# Patient Record
Sex: Female | Born: 1980
Health system: Southern US, Community
[De-identification: ages and names within clinical notes are randomized; demographics above are authoritative.]

## PROBLEM LIST (undated history)

## (undated) DIAGNOSIS — R7611 Nonspecific reaction to tuberculin skin test without active tuberculosis: Secondary | ICD-10-CM

## (undated) DIAGNOSIS — K219 Gastro-esophageal reflux disease without esophagitis: Secondary | ICD-10-CM

## (undated) DIAGNOSIS — Z87898 Personal history of other specified conditions: Secondary | ICD-10-CM

## (undated) DIAGNOSIS — I10 Essential (primary) hypertension: Secondary | ICD-10-CM

## (undated) DIAGNOSIS — R519 Headache, unspecified: Secondary | ICD-10-CM

## (undated) DIAGNOSIS — T7840XA Allergy, unspecified, initial encounter: Secondary | ICD-10-CM

## (undated) HISTORY — DX: Gastro-esophageal reflux disease without esophagitis: K21.9

## (undated) HISTORY — DX: Allergy, unspecified, initial encounter: T78.40XA

## (undated) HISTORY — DX: Nonspecific reaction to tuberculin skin test without active tuberculosis: R76.11

## (undated) HISTORY — DX: Personal history of other specified conditions: Z87.898

---
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
## 2016-11-26 LAB — HM PAP SMEAR

## 2018-11-26 ENCOUNTER — Other Ambulatory Visit: Payer: Self-pay

## 2018-11-26 ENCOUNTER — Encounter: Payer: Self-pay | Admitting: Family Medicine

## 2018-11-26 ENCOUNTER — Ambulatory Visit (INDEPENDENT_AMBULATORY_CARE_PROVIDER_SITE_OTHER): Payer: No Typology Code available for payment source | Admitting: Family Medicine

## 2018-11-26 VITALS — BP 110/78 | HR 71 | Temp 98.1°F | Resp 16 | Ht 60.75 in | Wt 121.1 lb

## 2018-11-26 DIAGNOSIS — Z0000 Encounter for general adult medical examination without abnormal findings: Secondary | ICD-10-CM | POA: Diagnosis not present

## 2018-11-26 DIAGNOSIS — J301 Allergic rhinitis due to pollen: Secondary | ICD-10-CM | POA: Diagnosis not present

## 2018-11-26 DIAGNOSIS — J302 Other seasonal allergic rhinitis: Secondary | ICD-10-CM | POA: Insufficient documentation

## 2018-11-26 NOTE — Patient Instructions (Signed)
Follow up in 1 year or as needed Keep up the good work!  You look great! We'll call you with your allergy appt Call with any questions or concerns Welcome!  We're glad to have you! Happy New Year!!!

## 2018-11-26 NOTE — Assessment & Plan Note (Signed)
Pt has hx of this.  Asking for referral to one of her allergy partners- referral placed.

## 2018-11-26 NOTE — Assessment & Plan Note (Signed)
Pt's PE WNL.  UTD on pap, immunizations.  No need for labs (pt will do through [facility] for [facility]).  Anticipatory guidance provided.

## 2018-11-26 NOTE — Progress Notes (Signed)
   Subjective:    Patient ID: Sarah [name], female    DOB: [birth year], 38 y.o.   MRN: [id number]  HPI New to establish.  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  UTD on pap, last done 2018.  No concerns today.  Needs allergy referral due to insurance   Review of Systems Patient reports no vision/ hearing changes, adenopathy,fever, weight change,  persistant/recurrent hoarseness , swallowing issues, chest pain, palpitations, edema, persistant/recurrent cough, hemoptysis, dyspnea (rest/exertional/paroxysmal nocturnal), gastrointestinal bleeding (melena, rectal bleeding), abdominal pain, significant heartburn, bowel changes, GU symptoms (dysuria, hematuria, incontinence), Gyn symptoms (abnormal  bleeding, pain),  syncope, focal weakness, memory loss, numbness & tingling, skin/hair/nail changes, abnormal bruising or bleeding, anxiety, or depression.     Objective:   Physical Exam General Appearance:    Alert, cooperative, no distress, appears stated age  Head:    Normocephalic, without obvious abnormality, atraumatic  Eyes:    PERRL, conjunctiva/corneas clear, EOM's intact, fundi    benign, both eyes  Ears:    Normal TM's and external ear canals, both ears  Nose:   Nares normal, septum midline, mucosa normal, no drainage    or sinus tenderness  Throat:   Lips, mucosa, and tongue normal; teeth and gums normal  Neck:   Supple, symmetrical, trachea midline, no adenopathy;    Thyroid: no enlargement/tenderness/nodules  Back:     Symmetric, no curvature, ROM normal, no CVA tenderness  Lungs:     Clear to auscultation bilaterally, respirations unlabored  Chest Wall:    No tenderness or deformity   Heart:    Regular rate and rhythm, S1 and S2 normal, no murmur, rub   or gallop  Breast Exam:    Deferred to GYN  Abdomen:     Soft, non-tender, bowel sounds active all four quadrants,    no masses, no organomegaly  Genitalia:    Deferred to GYN  Rectal:    Extremities:   Extremities normal,  atraumatic, no cyanosis or edema  Pulses:   2+ and symmetric all extremities  Skin:   Skin color, texture, turgor normal, no rashes or lesions  Lymph nodes:   Cervical, supraclavicular, and axillary nodes normal  Neurologic:   CNII-XII intact, normal strength, sensation and reflexes    throughout          Assessment & Plan:

## 2018-12-09 ENCOUNTER — Encounter: Payer: Self-pay | Admitting: Family Medicine

## 2018-12-09 ENCOUNTER — Telehealth: Payer: No Typology Code available for payment source | Admitting: Family Medicine

## 2018-12-09 DIAGNOSIS — J329 Chronic sinusitis, unspecified: Secondary | ICD-10-CM | POA: Diagnosis not present

## 2018-12-09 MED ORDER — DOXYCYCLINE HYCLATE 100 MG PO TABS: 100.0000 mg | ORAL_TABLET | Freq: Two times a day (BID) | ORAL | 0 refills | Status: DC

## 2018-12-09 MED FILL — DOXYCYCLINE HYCLATE 100 MG: 100 | 10 days supply | Qty: 20 | Fill #0

## 2018-12-09 NOTE — Addendum Note (Signed)
Addended by: Jannifer Rodney A on: 12/09/2018 04:50 PM   Modules accepted: Orders

## 2018-12-09 NOTE — Progress Notes (Signed)

## 2018-12-19 ENCOUNTER — Encounter: Payer: Self-pay | Admitting: Allergy

## 2018-12-19 ENCOUNTER — Ambulatory Visit (INDEPENDENT_AMBULATORY_CARE_PROVIDER_SITE_OTHER): Payer: No Typology Code available for payment source | Admitting: Allergy

## 2018-12-19 VITALS — BP 120/82 | HR 92 | Temp 98.1°F | Resp 16 | Ht 60.5 in | Wt 123.6 lb

## 2018-12-19 DIAGNOSIS — J4520 Mild intermittent asthma, uncomplicated: Secondary | ICD-10-CM | POA: Diagnosis not present

## 2018-12-19 DIAGNOSIS — T781XXD Other adverse food reactions, not elsewhere classified, subsequent encounter: Secondary | ICD-10-CM | POA: Diagnosis not present

## 2018-12-19 DIAGNOSIS — L309 Dermatitis, unspecified: Secondary | ICD-10-CM

## 2018-12-19 DIAGNOSIS — J301 Allergic rhinitis due to pollen: Secondary | ICD-10-CM

## 2018-12-19 DIAGNOSIS — H1013 Acute atopic conjunctivitis, bilateral: Secondary | ICD-10-CM

## 2018-12-19 NOTE — Progress Notes (Signed)
New Patient Note  RE: Sarah [name] MRN: [id number] DOB: [birth year] Date of Office Visit: 12/19/2018  Referring provider: Sheliah Hatch, MD Primary care provider: Sheliah Hatch, MD  Chief Complaint: allergies  History of present illness: Sarah [name] is a 38 y.o. female presenting today for consultation for allergic rhinitis.    She reports primarily having springtime symptoms including itchy/watery eyes, sneezing, nasal congestion/drainage.  She has had these issues for the past 9 years or so.  She has had some allergy testing in the past and knows that she is positive to birch. She states she has good response with the use of Xyzal, Nasonex and Optivar that she only has to take in the springtime. She states she has pollen food allergy syndrome as with fresh apples and cherries she does develop an itchy mouth and mild lip swelling.  That she tries to avoid fresh apples and cherries. She is now concerned with possible food allergy to soy, tree nuts and potentially sesame as after ingestion she develops a coughing and itchy throat.  That she has been trying to avoid these items as well.  This has become an issue over the past year or so. She feels she has an asthma attack while running outside in cold weather where she has cough and SOB about 8 years or so ago.  She does feels with illness she gets some chest tightness.  She states she has used her daughter's albuterol on occasion during illnesses when she does have the chest tightness. No history of eczema.  However she does have a rough dry patch on her upper back that she states is not itchy but it does not change.  She has not tried any topical medications on this rash.  Review of systems: Review of Systems  Constitutional: Negative for chills, fever and malaise/fatigue.  HENT: Negative for congestion, nosebleeds and sore throat.   Eyes: Negative for pain, discharge and redness.  Respiratory: Negative for cough, shortness of breath  and wheezing.   Cardiovascular: Negative for chest pain.  Gastrointestinal: Negative for abdominal pain, constipation, diarrhea, heartburn, nausea and vomiting.  Musculoskeletal: Negative for joint pain.  Skin: Positive for rash. Negative for itching.  Neurological: Negative for headaches.    All other systems negative unless noted above in HPI  Past medical history: Past Medical History:  Diagnosis Date  . Allergy   . GERD (gastroesophageal reflux disease)   . History of urinary incontinence    just after birth of children  . 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 TONSILLECTOMY AND ADENOIDECTOMY  1993    Family history:  Family History  Problem Relation Age of Onset  . Hypertension Mother   . Polycythemia Mother   . Allergic rhinitis Father   . Healthy Brother   . Allergic rhinitis Brother   . Food Allergy Brother   . Healthy Daughter   . Dementia Maternal Grandmother   . Hyperlipidemia Maternal Grandfather   . Hypertension Maternal Grandfather   . Stroke Maternal Grandfather   . Arthritis Paternal Grandmother   . Hearing loss Paternal Grandmother   . Asthma Neg Hx   . Angioedema Neg Hx   . Eczema Neg Hx   . Urticaria Neg Hx     Social history: She is an Proofreader.  Lives in a home with carpeting with gas heating and central cooling.  There is  carpeting in the bedroom.  There is a dog in the home.  There is no concern for water damage or mildew or roaches in the home.  She has no smoking history.  Medication List: Allergies as of 12/19/2018   No Known Allergies     Medication List       Accurate as of December 19, 2018  6:07 PM. Always use your most recent med list.        azelastine 0.05 % ophthalmic solution Commonly known as:  OPTIVAR 1 drop 2 (two) times daily.   doxycycline 100 MG tablet Commonly known as:  VIBRA-TABS Take 1 tablet (100 mg total) by mouth 2  (two) times daily.   levocetirizine 5 MG tablet Commonly known as:  XYZAL Take 5 mg by mouth every evening.   mometasone 50 MCG/ACT nasal spray Commonly known as:  NASONEX Place 2 sprays into the nose daily.   multivitamin tablet Take 1 tablet by mouth daily.       Known medication allergies: No Known Allergies   Physical examination: Blood pressure 120/82, pulse 92, temperature 98.1 F (36.7 C), temperature source Oral, resp. rate 16, height 5' 0.5" (1.537 m), weight 123 lb 9.6 oz (56.1 kg), SpO2 98 %.  General: Alert, interactive, in no acute distress. HEENT: PERRLA, TMs pearly gray, turbinates minimally edematous with clear discharge, post-pharynx non erythematous. Neck: Supple without lymphadenopathy. Lungs: Clear to auscultation without wheezing, rhonchi or rales. {no increased work of breathing. CV: Normal S1, S2 without murmurs. Abdomen: Nondistended, nontender. Skin: dry, mild hyperpigmented fine papular rash about the size of a silver dollar. Extremities:  No clubbing, cyanosis or edema. Neuro:   Grossly intact.  Diagnositics/Labs: Spirometry: FEV1: 2.18L 92%, FVC: 2.5L 91%, ratio consistent with nonobstructive pattern  Allergy testing: environmental allergy skin prick testing is positive to [place] grass, lamb quarters, tree pollens, cat.   Select food allergy skin prick testing is positive to soybean, sesame, walnut and almond Allergy testing results were read and interpreted by provider, documented by clinical staff.   Assessment and plan:   Allergic rhinitis with conjunctivitis  - environmental allergy skin testing today is positive to tree pollens, [place] grass, lamb's quarter and cat.   - allergen avoidance measures provided  - continue use of Xyzal 5mg  daily as needed (daily during spring)  - continue Nasonex 2 sprays each nostril daily as needed.  Recommend use for 1-2 weeks at time before stopping once symptoms improve.    - use nasal saline spray  or gel to help keep nose moisturized and decrease risk of nosebleeds  - continue use of Optivar eye drop 1 eye twice a day as needed for itchy/red/watery eyes  - allergen immunotherapy discussed today including protocol, benefits and risk.  Will see how symptoms are this spring and if not management with medication consider starting immunotherapy.    Adverse food reaction   - skin testing to select foods is positive to soybean, sesame, walnut and almond  - would recommend avoidance of foods as above  - have access to self-injectable epinephrine (Epipen or AuviQ) 0.3mg  at all times  - follow emergency action plan in case of allergic reaction  Pollen food allergy syndrome  - The oral allergy syndrome (OAS) or pollen-food allergy syndrome (PFAS) is a relatively common form of food allergy, particularly in adults. It typically occurs in people who have pollen allergies when the immune system "sees" proteins on the food that look like proteins on the pollen. This  results in the allergy antibody (IgE) binding to the food instead of the pollen. Patients typically report itching and/or mild swelling of the mouth and throat immediately following ingestion of certain uncooked fruits (including nuts) or raw vegetables. Only a very small number of affected individuals experience systemic allergic reactions, such as anaphylaxis which occurs with true food allergies.    - recommend continue avoidance of fresh apple and cherries to avoid oral symptoms.   Food normally tolerated in cooked/baked forms.    Reactive airway   - cough, chest tightness with illnesses likely represents reactive airway.   - would recommend having access to albuterol inhaler for as needed use.  have access to albuterol inhaler 2 puffs every 4-6 hours as needed for cough/wheeze/shortness of breath/chest tightness.  May use 15-20 minutes prior to activity.   Monitor frequency of use.    - if symptoms become more frequent would consider  singulair first then ICS if necessary.   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     I appreciate the opportunity to take part in Nicholle's care. Please do not hesitate to contact me with questions.  Sincerely,   Margo Aye, MD Allergy/Immunology [facility]

## 2018-12-19 NOTE — Patient Instructions (Addendum)
Allergic rhinitis with conjunctivitis  - environmental allergy skin testing today is positive to tree pollens, [place] grass, lamb's quarter and cat.    - allergen avoidance measures provided  - continue use of Xyzal 5mg  daily as needed (daily during spring)  - continue Nasonex 2 sprays each nostril daily as needed.  Recommend use for 1-2 weeks at time before stopping once symptoms improve.    - use nasal saline spray or gel to help keep nose moisturized and decrease risk of nosebleeds  - continue use of Optivar eye drop 1 eye twice a day as needed for itchy/red/watery eyes  - allergen immunotherapy discussed today including protocol, benefits and risk.  Will see how symptoms are this spring and if not management with medication consider starting immunotherapy.    Adverse food reaction   - skin testing to select foods is positive to soybean, sesame, walnut and almond  - would recommend avoidance of foods as above  - have access to self-injectable epinephrine (Epipen or AuviQ) 0.3mg  at all times  - follow emergency action plan in case of allergic reaction  Pollen food allergy syndrome  - The oral allergy syndrome (OAS) or pollen-food allergy syndrome (PFAS) is a relatively common form of food allergy, particularly in adults. It typically occurs in people who have pollen allergies when the immune system "sees" proteins on the food that look like proteins on the pollen. This results in the allergy antibody (IgE) binding to the food instead of the pollen. Patients typically report itching and/or mild swelling of the mouth and throat immediately following ingestion of certain uncooked fruits (including nuts) or raw vegetables. Only a very small number of affected individuals experience systemic allergic reactions, such as anaphylaxis which occurs with true food allergies.    - recommend continue avoidance of fresh apple and cherries to avoid oral symptoms.   Food normally tolerated in cooked/baked forms.     Reactive airway   - cough, chest tightness with illnesses likely represents reactive airway.   - would recommend having access to albuterol inhaler for as needed use.  have access to albuterol inhaler 2 puffs every 4-6 hours as needed for cough/wheeze/shortness of breath/chest tightness.  May use 15-20 minutes prior to activity.   Monitor frequency of use.    - if symptoms become more frequent would consider singulair first then ICS if necessary.   Follow-up 6 months or sooner if needed.

## 2018-12-19 NOTE — Addendum Note (Signed)
Addended by: Mliss FritzBLACK, Elzada Pytel I on: 12/19/2018 06:36 PM   Modules accepted: Orders

## 2019-04-23 ENCOUNTER — Encounter: Payer: Self-pay | Admitting: Family Medicine

## 2019-04-23 ENCOUNTER — Ambulatory Visit (INDEPENDENT_AMBULATORY_CARE_PROVIDER_SITE_OTHER): Payer: No Typology Code available for payment source | Admitting: Family Medicine

## 2019-04-23 ENCOUNTER — Other Ambulatory Visit: Payer: Self-pay

## 2019-04-23 VITALS — BP 110/70 | HR 79 | Temp 99.0°F | Ht 60.0 in | Wt 120.0 lb

## 2019-04-23 DIAGNOSIS — M5410 Radiculopathy, site unspecified: Secondary | ICD-10-CM

## 2019-04-23 NOTE — Progress Notes (Signed)
I have discussed the procedure for the virtual visit with the patient who has given consent to proceed with assessment and treatment.   Jessica L Brodmerkel, CMA     

## 2019-04-23 NOTE — Progress Notes (Signed)
   Virtual Visit via Video   I connected with patient on 04/23/19 at  4:00 PM EDT by a video enabled telemedicine application and verified that I am speaking with the correct person using two identifiers.  Location patient: Home Location provider: [facility], Office Persons participating in the virtual visit: Patient, Provider, CMA (Jess B)  I discussed the limitations of evaluation and management by telemedicine and the availability of in person appointments. The patient expressed understanding and agreed to proceed.  Subjective:   HPI:   Leg pain- L leg.  Similar sxs previously, required MRI and PT.  sxs last flared 1 yr ago until 2-3 weeks ago when pt started working out.  Pt will have radiating pain from buttock to toes. Described as a 'numbness'.  No associated weakness.  MRI was unrevealing.  Pt is not currently taking any NSAIDs.  Has been doing stretching and PT exercise.  No heat or ice.  ROS:   See pertinent positives and negatives per HPI.  Patient Active Problem List   Diagnosis Date Noted  . Seasonal allergic rhinitis 11/26/2018  . Physical exam 11/26/2018    Social History   Tobacco Use  . Smoking status: Never Smoker  . Smokeless tobacco: Never Used  Substance Use Topics  . Alcohol use: Yes    Current Outpatient Medications:  Marland Kitchen  Multiple Vitamin (MULTIVITAMIN) tablet, Take 1 tablet by mouth daily., Disp: , Rfl:  .  azelastine (OPTIVAR) 0.05 % ophthalmic solution, 1 drop 2 (two) times daily., Disp: , Rfl:  .  levocetirizine (XYZAL) 5 MG tablet, Take 5 mg by mouth every evening., Disp: , Rfl:  .  mometasone (NASONEX) 50 MCG/ACT nasal spray, Place 2 sprays into the nose daily., Disp: , Rfl:   No Known Allergies  Objective:   BP 110/70   Pulse 79   Temp 99 F (37.2 C) (Oral)   Ht 5' (1.524 m)   Wt 120 lb (54.4 kg)   BMI 23.44 kg/m   AAOx3, NAD NCAT, EOMI No obvious CN deficits Coloring WNL Pt is able to speak clearly, coherently without  shortness of breath or increased work of breathing.  Thought process is linear.  Mood is appropriate.   Assessment and Plan:   Radicular leg pain- new to provider, recurrent for pt.  No relief after 2 weeks of stretching.  Encouraged regularly scheduled NSAIDs for a few days, heat/ice.  Will refer to sports med at pt's request.  Pt expressed understanding and is in agreement w/ plan.    Neena Rhymes, MD 04/23/2019

## 2019-04-24 DIAGNOSIS — J301 Allergic rhinitis due to pollen: Secondary | ICD-10-CM

## 2019-04-24 NOTE — Addendum Note (Signed)
Addended by: Lorrin Mais on: 04/24/2019 08:27 AM   Modules accepted: Orders

## 2019-04-24 NOTE — Progress Notes (Signed)
VIALS EXP 04-23-2020 

## 2019-04-29 ENCOUNTER — Other Ambulatory Visit: Payer: Self-pay

## 2019-04-29 ENCOUNTER — Ambulatory Visit: Payer: No Typology Code available for payment source | Admitting: Family Medicine

## 2019-04-29 ENCOUNTER — Encounter: Payer: Self-pay | Admitting: Family Medicine

## 2019-04-29 VITALS — BP 143/98 | HR 102 | Ht 60.0 in | Wt 120.0 lb

## 2019-04-29 DIAGNOSIS — G5702 Lesion of sciatic nerve, left lower limb: Secondary | ICD-10-CM | POA: Insufficient documentation

## 2019-04-29 MED ORDER — GABAPENTIN 100 MG PO CAPS: 100.0000 mg | ORAL_CAPSULE | Freq: Every day | ORAL | 1 refills | Status: DC

## 2019-04-29 MED FILL — GABAPENTIN 100 MG CAPSULE: 100 | 30 days supply | Qty: 30 | Fill #0

## 2019-04-29 NOTE — Assessment & Plan Note (Addendum)
Symptoms seem more associated with piriformis with intermittent history and normal MRI. -Gabapentin. -Provided Pennsaid sample. If helps then can send more in.  -Counseled on home exercise therapy and supportive care. -Could consider imaging of the pelvis/hip and physical therapy if no improvement.

## 2019-04-29 NOTE — Progress Notes (Signed)
Sarah [name] - 38 y.o. female MRN [id number]  Date of birth: [birth year]  SUBJECTIVE:  Including CC & ROS.  Chief Complaint  Patient presents with  . Leg Pain    left leg    Sarah [name] is a 39 y.o. female that is presenting with left leg altered sensation.  This is occurring down the lateral aspect of the leg.  This is been ongoing for the past couple of weeks.  She denies any pain.  She only feels it when she is sitting on the affected side or when she begins to start working out.  She had similar symptoms last fall.  Those seem to go away as she quit exercising.  She denies any inciting event or specific trauma.  No history of back surgery.  The symptoms seem to be intermittent.  She did physical therapy for about 6 weeks last fall.  Review of the lumbar MRI from 2019 shows no disc herniation and shows intra sacral cysts   Review of Systems  Constitutional: Negative for fever.  HENT: Negative for congestion.   Respiratory: Negative for cough.   Cardiovascular: Negative for chest pain.  Gastrointestinal: Negative for abdominal pain.  Musculoskeletal: Negative for gait problem.  Skin: Negative for color change.  Neurological: Positive for numbness.  Hematological: Negative for adenopathy.    HISTORY: Past Medical, Surgical, Social, and Family History Reviewed & Updated per EMR.   Pertinent Historical Findings include:  Past Medical History:  Diagnosis Date  . Allergy   . GERD (gastroesophageal reflux disease)   . History of urinary incontinence    just after birth of children  . 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 TONSILLECTOMY AND ADENOIDECTOMY  1993    No Known Allergies  Family History  Problem Relation Age of Onset  . Hypertension Mother   . Polycythemia Mother   . Allergic rhinitis Father   . Healthy Brother   . Allergic rhinitis Brother   . Food Allergy Brother   . Healthy Daughter   . Dementia  Maternal Grandmother   . Hyperlipidemia Maternal Grandfather   . Hypertension Maternal Grandfather   . Stroke Maternal Grandfather   . Arthritis Paternal Grandmother   . Hearing loss Paternal Grandmother   . Asthma Neg Hx   . Angioedema Neg Hx   . Eczema Neg Hx   . Urticaria Neg Hx      Social History   Socioeconomic History  . Marital status: Married    Spouse name: Not on file  . Number of children: Not on file  . Years of education: Not on file  . Highest education level: Not on file  Occupational History  . Not on file  Social Needs  . Financial resource strain: Not on file  . Food insecurity:    Worry: Not on file    Inability: Not on file  . Transportation needs:    Medical: Not on file    Non-medical: Not on file  Tobacco Use  . Smoking status: Never Smoker  . Smokeless tobacco: Never Used  Substance and Sexual Activity  . Alcohol use: Yes  . Drug use: Not Currently  . Sexual activity: Yes  Lifestyle  . Physical activity:    Days per week: Not on file    Minutes per session: Not on file  . Stress: Not on file  Relationships  . Social connections:    Talks  on phone: Not on file    Gets together: Not on file    Attends religious service: Not on file    Active member of club or organization: Not on file    Attends meetings of clubs or organizations: Not on file    Relationship status: Not on file  . Intimate partner violence:    Fear of current or ex partner: Not on file    Emotionally abused: Not on file    Physically abused: Not on file    Forced sexual activity: Not on file  Other Topics Concern  . Not on file  Social History Narrative  . Not on file     PHYSICAL EXAM:  VS: BP (!) 143/98   Pulse (!) 102   Ht 5' (1.524 m)   Wt 120 lb (54.4 kg)   BMI 23.44 kg/m  Physical Exam Gen: NAD, alert, cooperative with exam, well-appearing ENT: normal lips, normal nasal mucosa,  Eye: normal EOM, normal conjunctiva and lids CV:  no edema, +2 pedal  pulses   Resp: no accessory muscle use, non-labored,  Skin: no rashes, no areas of induration  Neuro: normal tone, normal sensation to touch Psych:  normal insight, alert and oriented MSK:  Back/left hip: No tenderness to palpation over SI joint or greater trochanter. Exaggerated internal rotation of the hip. Normal strength resistance with hip flexion, knee flexion and extension, plantarflexion and dorsiflexion. Negative straight leg raise. Normal gait Neurovascular intact     ASSESSMENT & PLAN:   Piriformis syndrome of left side Symptoms seem more associated with piriformis with intermittent history and normal MRI. -Gabapentin. -Provided Pennsaid sample. If helps then can send more in.  -Counseled on home exercise therapy and supportive care. -Could consider imaging of the pelvis/hip and physical therapy if no improvement.

## 2019-04-29 NOTE — Patient Instructions (Signed)
Nice to meet you Please try the exercises  Please try the rub on medicine. If you want more then I can send a prescription in. Voltaren 1% is over the counter  Please try the gabapentin. You can start with one pill at night. It may make you sleepy. You can increase to 2 or 3 times daily as you tolerate.   Please send me a message in MyChart with any questions or updates.  Please see me back in 4 weeks.   --Dr. Raeford Razor

## 2019-04-30 ENCOUNTER — Ambulatory Visit (INDEPENDENT_AMBULATORY_CARE_PROVIDER_SITE_OTHER): Payer: No Typology Code available for payment source | Admitting: *Deleted

## 2019-04-30 DIAGNOSIS — J309 Allergic rhinitis, unspecified: Secondary | ICD-10-CM | POA: Diagnosis not present

## 2019-05-08 ENCOUNTER — Ambulatory Visit (INDEPENDENT_AMBULATORY_CARE_PROVIDER_SITE_OTHER): Payer: No Typology Code available for payment source

## 2019-05-08 DIAGNOSIS — J309 Allergic rhinitis, unspecified: Secondary | ICD-10-CM

## 2019-05-15 ENCOUNTER — Ambulatory Visit (INDEPENDENT_AMBULATORY_CARE_PROVIDER_SITE_OTHER): Payer: No Typology Code available for payment source | Admitting: *Deleted

## 2019-05-15 DIAGNOSIS — J309 Allergic rhinitis, unspecified: Secondary | ICD-10-CM

## 2019-05-22 ENCOUNTER — Ambulatory Visit (INDEPENDENT_AMBULATORY_CARE_PROVIDER_SITE_OTHER): Payer: No Typology Code available for payment source | Admitting: *Deleted

## 2019-05-22 DIAGNOSIS — J309 Allergic rhinitis, unspecified: Secondary | ICD-10-CM | POA: Diagnosis not present

## 2019-05-26 ENCOUNTER — Ambulatory Visit (INDEPENDENT_AMBULATORY_CARE_PROVIDER_SITE_OTHER): Payer: No Typology Code available for payment source

## 2019-05-26 DIAGNOSIS — J309 Allergic rhinitis, unspecified: Secondary | ICD-10-CM | POA: Diagnosis not present

## 2019-05-27 ENCOUNTER — Ambulatory Visit: Payer: No Typology Code available for payment source | Admitting: Family Medicine

## 2019-06-02 ENCOUNTER — Ambulatory Visit (INDEPENDENT_AMBULATORY_CARE_PROVIDER_SITE_OTHER): Payer: No Typology Code available for payment source

## 2019-06-02 DIAGNOSIS — J309 Allergic rhinitis, unspecified: Secondary | ICD-10-CM

## 2019-06-04 ENCOUNTER — Ambulatory Visit (INDEPENDENT_AMBULATORY_CARE_PROVIDER_SITE_OTHER): Payer: No Typology Code available for payment source | Admitting: *Deleted

## 2019-06-04 DIAGNOSIS — J309 Allergic rhinitis, unspecified: Secondary | ICD-10-CM

## 2019-06-11 ENCOUNTER — Ambulatory Visit (INDEPENDENT_AMBULATORY_CARE_PROVIDER_SITE_OTHER): Payer: No Typology Code available for payment source | Admitting: *Deleted

## 2019-06-11 DIAGNOSIS — J309 Allergic rhinitis, unspecified: Secondary | ICD-10-CM | POA: Diagnosis not present

## 2019-06-16 ENCOUNTER — Ambulatory Visit (INDEPENDENT_AMBULATORY_CARE_PROVIDER_SITE_OTHER): Payer: No Typology Code available for payment source

## 2019-06-16 DIAGNOSIS — J309 Allergic rhinitis, unspecified: Secondary | ICD-10-CM | POA: Diagnosis not present

## 2019-06-18 ENCOUNTER — Ambulatory Visit (INDEPENDENT_AMBULATORY_CARE_PROVIDER_SITE_OTHER): Payer: No Typology Code available for payment source

## 2019-06-18 DIAGNOSIS — J309 Allergic rhinitis, unspecified: Secondary | ICD-10-CM | POA: Diagnosis not present

## 2019-06-23 ENCOUNTER — Ambulatory Visit (INDEPENDENT_AMBULATORY_CARE_PROVIDER_SITE_OTHER): Payer: No Typology Code available for payment source | Admitting: *Deleted

## 2019-06-23 DIAGNOSIS — J309 Allergic rhinitis, unspecified: Secondary | ICD-10-CM | POA: Diagnosis not present

## 2019-07-02 ENCOUNTER — Ambulatory Visit (INDEPENDENT_AMBULATORY_CARE_PROVIDER_SITE_OTHER): Payer: No Typology Code available for payment source

## 2019-07-02 DIAGNOSIS — J309 Allergic rhinitis, unspecified: Secondary | ICD-10-CM

## 2019-07-07 ENCOUNTER — Ambulatory Visit (INDEPENDENT_AMBULATORY_CARE_PROVIDER_SITE_OTHER): Payer: No Typology Code available for payment source | Admitting: *Deleted

## 2019-07-07 DIAGNOSIS — J309 Allergic rhinitis, unspecified: Secondary | ICD-10-CM

## 2019-07-14 ENCOUNTER — Ambulatory Visit (INDEPENDENT_AMBULATORY_CARE_PROVIDER_SITE_OTHER): Payer: No Typology Code available for payment source | Admitting: *Deleted

## 2019-07-14 DIAGNOSIS — J309 Allergic rhinitis, unspecified: Secondary | ICD-10-CM | POA: Diagnosis not present

## 2019-07-21 ENCOUNTER — Ambulatory Visit (INDEPENDENT_AMBULATORY_CARE_PROVIDER_SITE_OTHER): Payer: No Typology Code available for payment source

## 2019-07-21 DIAGNOSIS — J309 Allergic rhinitis, unspecified: Secondary | ICD-10-CM

## 2019-07-30 ENCOUNTER — Ambulatory Visit (INDEPENDENT_AMBULATORY_CARE_PROVIDER_SITE_OTHER): Payer: No Typology Code available for payment source | Admitting: *Deleted

## 2019-07-30 DIAGNOSIS — J309 Allergic rhinitis, unspecified: Secondary | ICD-10-CM | POA: Diagnosis not present

## 2019-08-04 ENCOUNTER — Ambulatory Visit (INDEPENDENT_AMBULATORY_CARE_PROVIDER_SITE_OTHER): Payer: No Typology Code available for payment source | Admitting: *Deleted

## 2019-08-04 DIAGNOSIS — J309 Allergic rhinitis, unspecified: Secondary | ICD-10-CM | POA: Diagnosis not present

## 2019-08-11 ENCOUNTER — Ambulatory Visit (INDEPENDENT_AMBULATORY_CARE_PROVIDER_SITE_OTHER): Payer: No Typology Code available for payment source

## 2019-08-11 DIAGNOSIS — J309 Allergic rhinitis, unspecified: Secondary | ICD-10-CM | POA: Diagnosis not present

## 2019-08-13 ENCOUNTER — Other Ambulatory Visit: Payer: Self-pay | Admitting: Family Medicine

## 2019-08-13 ENCOUNTER — Telehealth: Payer: Self-pay | Admitting: Allergy & Immunology

## 2019-08-13 DIAGNOSIS — Z20822 Contact with and (suspected) exposure to covid-19: Secondary | ICD-10-CM

## 2019-08-13 NOTE — Addendum Note (Signed)
Addended by: Valentina Shaggy on: 08/13/2019 11:42 AM   Modules accepted: Orders

## 2019-08-13 NOTE — Addendum Note (Signed)
Addended by: Linna Thebeau LOUIS on: 08/13/2019 02:02 PM   Modules accepted: Orders  

## 2019-08-13 NOTE — Telephone Encounter (Signed)
Added COVID antibody testing to labs.   Denean Pavon, MD [facility]   

## 2019-08-14 LAB — SARS-COV-2 ANTIBODIES: SARS-CoV-2 Antibodies: NEGATIVE

## 2019-08-20 ENCOUNTER — Ambulatory Visit (INDEPENDENT_AMBULATORY_CARE_PROVIDER_SITE_OTHER): Payer: No Typology Code available for payment source

## 2019-08-20 DIAGNOSIS — J309 Allergic rhinitis, unspecified: Secondary | ICD-10-CM | POA: Diagnosis not present

## 2019-08-25 ENCOUNTER — Ambulatory Visit (INDEPENDENT_AMBULATORY_CARE_PROVIDER_SITE_OTHER): Payer: No Typology Code available for payment source | Admitting: *Deleted

## 2019-08-25 DIAGNOSIS — J309 Allergic rhinitis, unspecified: Secondary | ICD-10-CM | POA: Diagnosis not present

## 2019-09-03 ENCOUNTER — Ambulatory Visit (INDEPENDENT_AMBULATORY_CARE_PROVIDER_SITE_OTHER): Payer: No Typology Code available for payment source

## 2019-09-03 DIAGNOSIS — J309 Allergic rhinitis, unspecified: Secondary | ICD-10-CM

## 2019-09-08 ENCOUNTER — Ambulatory Visit (INDEPENDENT_AMBULATORY_CARE_PROVIDER_SITE_OTHER): Payer: No Typology Code available for payment source

## 2019-09-08 DIAGNOSIS — J309 Allergic rhinitis, unspecified: Secondary | ICD-10-CM

## 2019-09-15 ENCOUNTER — Ambulatory Visit (INDEPENDENT_AMBULATORY_CARE_PROVIDER_SITE_OTHER): Payer: No Typology Code available for payment source | Admitting: *Deleted

## 2019-09-15 DIAGNOSIS — J309 Allergic rhinitis, unspecified: Secondary | ICD-10-CM

## 2019-09-23 ENCOUNTER — Ambulatory Visit: Payer: No Typology Code available for payment source | Admitting: Family Medicine

## 2019-09-29 ENCOUNTER — Ambulatory Visit (INDEPENDENT_AMBULATORY_CARE_PROVIDER_SITE_OTHER): Payer: No Typology Code available for payment source

## 2019-09-29 DIAGNOSIS — J309 Allergic rhinitis, unspecified: Secondary | ICD-10-CM

## 2019-10-07 ENCOUNTER — Ambulatory Visit (INDEPENDENT_AMBULATORY_CARE_PROVIDER_SITE_OTHER): Payer: No Typology Code available for payment source | Admitting: *Deleted

## 2019-10-07 DIAGNOSIS — J309 Allergic rhinitis, unspecified: Secondary | ICD-10-CM

## 2019-10-15 ENCOUNTER — Ambulatory Visit (INDEPENDENT_AMBULATORY_CARE_PROVIDER_SITE_OTHER): Payer: No Typology Code available for payment source | Admitting: *Deleted

## 2019-10-15 DIAGNOSIS — J309 Allergic rhinitis, unspecified: Secondary | ICD-10-CM | POA: Diagnosis not present

## 2019-10-20 ENCOUNTER — Ambulatory Visit (INDEPENDENT_AMBULATORY_CARE_PROVIDER_SITE_OTHER): Payer: No Typology Code available for payment source | Admitting: *Deleted

## 2019-10-20 DIAGNOSIS — J309 Allergic rhinitis, unspecified: Secondary | ICD-10-CM | POA: Diagnosis not present

## 2019-11-03 ENCOUNTER — Ambulatory Visit (INDEPENDENT_AMBULATORY_CARE_PROVIDER_SITE_OTHER): Payer: No Typology Code available for payment source

## 2019-11-03 DIAGNOSIS — J309 Allergic rhinitis, unspecified: Secondary | ICD-10-CM | POA: Diagnosis not present

## 2019-11-05 ENCOUNTER — Other Ambulatory Visit: Payer: Self-pay

## 2019-11-05 MED ORDER — AZELASTINE-FLUTICASONE 137-50 MCG/ACT NA SUSP: 1.0000 | Freq: Two times a day (BID) | NASAL | 5 refills | Status: DC

## 2019-11-05 NOTE — Telephone Encounter (Signed)
Per Dr. Nelva Bush request to send in generic Dymista to pharmacy.

## 2019-11-10 ENCOUNTER — Telehealth: Payer: Self-pay | Admitting: *Deleted

## 2019-11-10 DIAGNOSIS — J301 Allergic rhinitis due to pollen: Secondary | ICD-10-CM

## 2019-11-10 NOTE — Progress Notes (Signed)
VIAL EXP 11-09-20 

## 2019-11-10 NOTE — Telephone Encounter (Signed)
PA has been submitted for generic Dymista through CoverMyMeds is currently pending for approval or denial.

## 2019-11-11 MED FILL — AZELASTINE-FLUTICASONE 137-: 137-50 | 30 days supply | Qty: 23 | Fill #0

## 2019-11-11 NOTE — Telephone Encounter (Signed)
PA has been approved for generic Dymista. Approval form has been faxed to pharmacy, labeled, and placed in bulk scanning.

## 2019-11-11 NOTE — Telephone Encounter (Signed)
PA still pending.  

## 2019-12-02 ENCOUNTER — Encounter: Payer: Self-pay | Admitting: Family Medicine

## 2019-12-02 ENCOUNTER — Other Ambulatory Visit: Payer: Self-pay

## 2019-12-02 ENCOUNTER — Ambulatory Visit (INDEPENDENT_AMBULATORY_CARE_PROVIDER_SITE_OTHER): Payer: No Typology Code available for payment source | Admitting: Family Medicine

## 2019-12-02 VITALS — BP 108/70 | HR 59 | Temp 97.8°F | Resp 16 | Ht 60.0 in | Wt 119.4 lb

## 2019-12-02 DIAGNOSIS — L309 Dermatitis, unspecified: Secondary | ICD-10-CM | POA: Diagnosis not present

## 2019-12-02 DIAGNOSIS — Z0001 Encounter for general adult medical examination with abnormal findings: Secondary | ICD-10-CM

## 2019-12-02 DIAGNOSIS — R079 Chest pain, unspecified: Secondary | ICD-10-CM

## 2019-12-02 DIAGNOSIS — J301 Allergic rhinitis due to pollen: Secondary | ICD-10-CM | POA: Diagnosis not present

## 2019-12-02 DIAGNOSIS — Z0000 Encounter for general adult medical examination without abnormal findings: Secondary | ICD-10-CM

## 2019-12-02 MED ORDER — TRIAMCINOLONE ACETONIDE 0.1 % EX OINT: 1.0000 "application " | TOPICAL_OINTMENT | Freq: Two times a day (BID) | CUTANEOUS | 1 refills | Status: AC

## 2019-12-02 MED FILL — TRIAMCINOLONE 0.1% OINTMENT: 0.1 | 15 days supply | Qty: 90 | Fill #0

## 2019-12-02 NOTE — Assessment & Plan Note (Signed)
Pt's PE WNL w/ exception of small eczematous patch on back.  UTD on immunizations, no need for mammo yet.  Pt will schedule pap.  Check labs.  Anticipatory guidance provided.

## 2019-12-02 NOTE — Progress Notes (Signed)
   Subjective:    Patient ID: Sarah [name], female    DOB: 06/28/1981, 39 y.o.   MRN: [id number]  HPI CPE- UTD on immunizations.  Due for repeat pap but will need to schedule at later date due to fact she has CP concerns today.   Review of Systems Patient reports no vision/ hearing changes, adenopathy,fever, weight change,  persistant/recurrent hoarseness , swallowing issues, palpitations, edema, persistant/recurrent cough, hemoptysis, dyspnea (rest/exertional/paroxysmal nocturnal), gastrointestinal bleeding (melena, rectal bleeding), abdominal pain, significant heartburn, bowel changes, GU symptoms (dysuria, hematuria, incontinence), Gyn symptoms (abnormal  bleeding, pain),  syncope, focal weakness, memory loss, numbness & tingling, hair/nail changes, abnormal bruising or bleeding, anxiety, or depression.   L sided chest discomfort- feels it may be stress related.  Not related to exertion, 'comes and goes'.  Discomfort described as 'pressure'.  Not related to eating or GERD.  No associated SOB.  No diaphoresis or nausea.  sxs started 'a couple of months' ago- not progressing or changing.  + rash on upper back- rough, doesn't itch.  Applying hydrocortisone cream  This visit occurred during the SARS-CoV-2 public health emergency.  Safety protocols were in place, including screening questions prior to the visit, additional usage of staff PPE, and extensive cleaning of exam room while observing appropriate contact time as indicated for disinfecting solutions.       Objective:   Physical Exam General Appearance:    Alert, cooperative, no distress, appears stated age  Head:    Normocephalic, without obvious abnormality, atraumatic  Eyes:    PERRL, conjunctiva/corneas clear, EOM's intact, fundi    benign, both eyes  Ears:    Normal TM's and external ear canals, both ears  Nose:   Deferred due to COVID  Throat:   Neck:   Supple, symmetrical, trachea midline, no adenopathy;    Thyroid: no  enlargement/tenderness/nodules  Back:     Symmetric, no curvature, ROM normal, no CVA tenderness  Lungs:     Clear to auscultation bilaterally, respirations unlabored  Chest Wall:    No tenderness or deformity   Heart:    Regular rate and rhythm, S1 and S2 normal, no murmur, rub   or gallop  Breast Exam:    Deferred to GYN  Abdomen:     Soft, non-tender, bowel sounds active all four quadrants,    no masses, no organomegaly  Genitalia:    Deferred to GYN  Rectal:    Extremities:   Extremities normal, atraumatic, no cyanosis or edema  Pulses:   2+ and symmetric all extremities  Skin:   Skin color, texture, turgor normal, eczematous patch on upper back  Lymph nodes:   Cervical, supraclavicular, and axillary nodes normal  Neurologic:   CNII-XII intact, normal strength, sensation and reflexes    throughout          Assessment & Plan:  CP- new.  Not exertional.  Pt reports this is not GERD related, no relation to eating.  Feels it may be stress related. Will get labs to risk stratify lipids, sugar, etc.  EKG WNL.  No further workup needed at this time but if symptoms change or worsen, pt to let me know so we can refer to cards.  Will encourage pt to log symptoms to determine if they are occurring during stressful moments.

## 2019-12-02 NOTE — Patient Instructions (Signed)
Schedule your pap and breast exam at your convenience We'll notify you of your lab results and make any changes if needed We placed your allergy referral Use the Triamcinolone ointment on the rough spot on your back twice daily until it improves EKG looks great!  I suspect the chest pressure is stress related but if it continues or worsens, please let me know Call with any questions or concerns Stay Safe!  Stay Healthy!!!

## 2019-12-02 NOTE — Assessment & Plan Note (Signed)
Referral placed as pt has Focus Plan

## 2019-12-22 ENCOUNTER — Ambulatory Visit (INDEPENDENT_AMBULATORY_CARE_PROVIDER_SITE_OTHER): Payer: No Typology Code available for payment source

## 2019-12-22 DIAGNOSIS — J309 Allergic rhinitis, unspecified: Secondary | ICD-10-CM

## 2019-12-23 ENCOUNTER — Encounter: Payer: Self-pay | Admitting: Family Medicine

## 2019-12-23 ENCOUNTER — Other Ambulatory Visit (HOSPITAL_COMMUNITY)
Admission: RE | Admit: 2019-12-23 | Discharge: 2019-12-23 | Disposition: A | Discharge status: Home / Self Care | Payer: No Typology Code available for payment source | Source: Ambulatory Visit | Attending: Family Medicine | Admitting: Family Medicine

## 2019-12-23 ENCOUNTER — Ambulatory Visit (INDEPENDENT_AMBULATORY_CARE_PROVIDER_SITE_OTHER): Payer: No Typology Code available for payment source | Admitting: Family Medicine

## 2019-12-23 ENCOUNTER — Other Ambulatory Visit: Payer: Self-pay

## 2019-12-23 VITALS — BP 110/72 | HR 76 | Temp 98.1°F | Resp 16 | Ht 60.0 in | Wt 122.5 lb

## 2019-12-23 DIAGNOSIS — Z124 Encounter for screening for malignant neoplasm of cervix: Secondary | ICD-10-CM | POA: Diagnosis present

## 2019-12-23 DIAGNOSIS — K649 Unspecified hemorrhoids: Secondary | ICD-10-CM

## 2019-12-23 DIAGNOSIS — Z1239 Encounter for other screening for malignant neoplasm of breast: Secondary | ICD-10-CM | POA: Diagnosis not present

## 2019-12-23 MED ORDER — HYDROCORTISONE ACETATE 25 MG RE SUPP: 25.0000 mg | Freq: Two times a day (BID) | RECTAL | 0 refills | Status: DC

## 2019-12-23 MED FILL — HYDROCORTISONE ACETATE 25 M: 25 | 6 days supply | Qty: 12 | Fill #0

## 2019-12-23 NOTE — Patient Instructions (Addendum)
Follow up as needed or as scheduled We'll notify you of your pap results Exam was normal today and everything looked good! Use the hydrocortisone suppositories as needed for hemorrhoids Call with any questions or concerns Stay Safe!  Stay Healthy!

## 2019-12-23 NOTE — Progress Notes (Signed)
   Subjective:    Patient ID: Sarah [name], female    DOB: 02/22/1981, 39 y.o.   MRN: [id number]  HPI GYN exam- pt here today for breast exam and pap.  Denies lumps, skin changes, nipple discharge, concerns for STIs, vaginal d/c, abnormal bleeding, or pain.  Cycles currently normal.  Pt is mid-cycle today  Hemorrhoids- pt reports these come and go since having kids.  Asking for medication.   Review of Systems For ROS see HPI   This visit occurred during the SARS-CoV-2 public health emergency.  Safety protocols were in place, including screening questions prior to the visit, additional usage of staff PPE, and extensive cleaning of exam room while observing appropriate contact time as indicated for disinfecting solutions.       Objective:   Physical Exam Vitals reviewed. Exam conducted with a chaperone present.  Constitutional:      General: She is not in acute distress.    Appearance: Normal appearance. She is normal weight. She is not ill-appearing.  Chest:     Chest wall: No mass, deformity or tenderness.     Breasts: Breasts are symmetrical.        Right: Normal. No swelling, bleeding, inverted nipple, mass, nipple discharge, skin change or tenderness.        Left: Normal. No swelling, bleeding, inverted nipple, mass, nipple discharge, skin change or tenderness.  Genitourinary:    General: Normal vulva.     Labia:        Right: No rash, tenderness, lesion or injury.        Left: No rash, tenderness, lesion or injury.      Urethra: No prolapse or urethral lesion.     Vagina: Normal. No signs of injury and foreign body. No vaginal discharge, erythema, tenderness, bleeding or lesions.     Cervix: Erythema (mild redness around os) present. No cervical motion tenderness, discharge, lesion or cervical bleeding.     Uterus: Normal. Not deviated, not enlarged, not fixed and not tender.      Adnexa: Right adnexa normal and left adnexa normal.       Right: No mass, tenderness or fullness.          Left: No mass, tenderness or fullness.       Rectum: No anal fissure or external hemorrhoid.  Neurological:     Mental Status: She is alert.           Assessment & Plan:  Pap- collected.  GYN exam WNL  Breast exam- WNL.  No concerning findings.  Start mammo at 41  Hemorrhoids- pt reports sxs are intermittent and have been present since child birth.  No visible hemorrhoids on exam today.  Hydrocortisone suppositories sent.

## 2019-12-24 LAB — CYTOLOGY - PAP
Comment: NEGATIVE
Diagnosis: NEGATIVE
High risk HPV: NEGATIVE

## 2019-12-29 ENCOUNTER — Ambulatory Visit (INDEPENDENT_AMBULATORY_CARE_PROVIDER_SITE_OTHER): Payer: No Typology Code available for payment source

## 2019-12-29 DIAGNOSIS — J309 Allergic rhinitis, unspecified: Secondary | ICD-10-CM

## 2020-01-05 ENCOUNTER — Ambulatory Visit (INDEPENDENT_AMBULATORY_CARE_PROVIDER_SITE_OTHER): Payer: No Typology Code available for payment source

## 2020-01-05 DIAGNOSIS — J309 Allergic rhinitis, unspecified: Secondary | ICD-10-CM

## 2020-01-12 ENCOUNTER — Ambulatory Visit (INDEPENDENT_AMBULATORY_CARE_PROVIDER_SITE_OTHER): Payer: No Typology Code available for payment source

## 2020-01-12 DIAGNOSIS — J309 Allergic rhinitis, unspecified: Secondary | ICD-10-CM | POA: Diagnosis not present

## 2020-01-14 ENCOUNTER — Encounter: Payer: Self-pay | Admitting: Family Medicine

## 2020-01-14 MED ORDER — OMEPRAZOLE 20 MG PO CPDR: 20.0000 mg | DELAYED_RELEASE_CAPSULE | Freq: Every day | ORAL | 3 refills | Status: DC

## 2020-01-14 MED FILL — OMEPRAZOLE DR 20 MG CAPSULE: 20 | 30 days supply | Qty: 30 | Fill #0

## 2020-01-19 ENCOUNTER — Ambulatory Visit (INDEPENDENT_AMBULATORY_CARE_PROVIDER_SITE_OTHER): Payer: No Typology Code available for payment source

## 2020-01-19 DIAGNOSIS — J309 Allergic rhinitis, unspecified: Secondary | ICD-10-CM

## 2020-01-28 ENCOUNTER — Ambulatory Visit (INDEPENDENT_AMBULATORY_CARE_PROVIDER_SITE_OTHER): Payer: No Typology Code available for payment source

## 2020-01-28 DIAGNOSIS — J309 Allergic rhinitis, unspecified: Secondary | ICD-10-CM

## 2020-02-02 ENCOUNTER — Ambulatory Visit (INDEPENDENT_AMBULATORY_CARE_PROVIDER_SITE_OTHER): Payer: No Typology Code available for payment source

## 2020-02-02 DIAGNOSIS — J309 Allergic rhinitis, unspecified: Secondary | ICD-10-CM | POA: Diagnosis not present

## 2020-02-16 ENCOUNTER — Ambulatory Visit (INDEPENDENT_AMBULATORY_CARE_PROVIDER_SITE_OTHER): Payer: No Typology Code available for payment source

## 2020-02-16 DIAGNOSIS — J309 Allergic rhinitis, unspecified: Secondary | ICD-10-CM | POA: Diagnosis not present

## 2020-02-23 ENCOUNTER — Telehealth: Payer: Self-pay

## 2020-02-23 ENCOUNTER — Other Ambulatory Visit: Payer: Self-pay | Admitting: Allergy

## 2020-02-23 MED ORDER — LEVOCETIRIZINE DIHYDROCHLORIDE 5 MG PO TABS: 5.0000 mg | ORAL_TABLET | Freq: Every evening | ORAL | 5 refills | Status: DC

## 2020-02-23 MED ORDER — OLOPATADINE HCL 0.2 % OP SOLN: 1.0000 [drp] | Freq: Every day | OPHTHALMIC | 5 refills | Status: DC | PRN

## 2020-02-23 MED ORDER — MONTELUKAST SODIUM 10 MG PO TABS: 10.0000 mg | ORAL_TABLET | Freq: Every day | ORAL | 5 refills | Status: DC

## 2020-02-23 MED FILL — OLOPATADINE HCL 0.2 % SOLN: 0.2 | 30 days supply | Qty: 3 | Fill #0

## 2020-02-23 MED FILL — AZELASTINE-FLUTICASONE 137-: 137-50 | 30 days supply | Qty: 23 | Fill #1

## 2020-02-23 MED FILL — OMEPRAZOLE DR 20 MG CAPSULE: 20 | 30 days supply | Qty: 30 | Fill #1

## 2020-02-23 MED FILL — MONTELUKAST SOD 10 MG TAB: 10 | 30 days supply | Qty: 30 | Fill #0

## 2020-02-23 MED FILL — LEVOCETIRIZINE 5 MG TABLET: 5 | 30 days supply | Qty: 30 | Fill #0

## 2020-02-23 NOTE — Telephone Encounter (Signed)
Patient has requested refills for Levocetirizine, Olopatadine and Montelukast. Per Dr. Delorse Lek refills sent in.

## 2020-03-01 ENCOUNTER — Ambulatory Visit (INDEPENDENT_AMBULATORY_CARE_PROVIDER_SITE_OTHER): Payer: No Typology Code available for payment source

## 2020-03-01 DIAGNOSIS — J309 Allergic rhinitis, unspecified: Secondary | ICD-10-CM

## 2020-03-15 ENCOUNTER — Ambulatory Visit (INDEPENDENT_AMBULATORY_CARE_PROVIDER_SITE_OTHER): Payer: No Typology Code available for payment source

## 2020-03-15 DIAGNOSIS — J309 Allergic rhinitis, unspecified: Secondary | ICD-10-CM | POA: Diagnosis not present

## 2020-03-29 ENCOUNTER — Ambulatory Visit (INDEPENDENT_AMBULATORY_CARE_PROVIDER_SITE_OTHER): Payer: No Typology Code available for payment source

## 2020-03-29 DIAGNOSIS — J309 Allergic rhinitis, unspecified: Secondary | ICD-10-CM | POA: Diagnosis not present

## 2020-04-06 MED FILL — AZELASTINE-FLUTICASONE 137-: 137-50 | 30 days supply | Qty: 23 | Fill #2

## 2020-04-12 ENCOUNTER — Ambulatory Visit (INDEPENDENT_AMBULATORY_CARE_PROVIDER_SITE_OTHER): Payer: No Typology Code available for payment source

## 2020-04-12 DIAGNOSIS — J309 Allergic rhinitis, unspecified: Secondary | ICD-10-CM

## 2020-04-26 ENCOUNTER — Ambulatory Visit (INDEPENDENT_AMBULATORY_CARE_PROVIDER_SITE_OTHER): Payer: No Typology Code available for payment source

## 2020-04-26 DIAGNOSIS — J309 Allergic rhinitis, unspecified: Secondary | ICD-10-CM

## 2020-04-29 DIAGNOSIS — J3081 Allergic rhinitis due to animal (cat) (dog) hair and dander: Secondary | ICD-10-CM | POA: Diagnosis not present

## 2020-04-29 NOTE — Progress Notes (Signed)
Exp 04/29/21 

## 2020-05-11 ENCOUNTER — Ambulatory Visit (INDEPENDENT_AMBULATORY_CARE_PROVIDER_SITE_OTHER): Payer: No Typology Code available for payment source

## 2020-05-11 DIAGNOSIS — J309 Allergic rhinitis, unspecified: Secondary | ICD-10-CM

## 2020-05-26 ENCOUNTER — Ambulatory Visit (INDEPENDENT_AMBULATORY_CARE_PROVIDER_SITE_OTHER): Payer: No Typology Code available for payment source

## 2020-05-26 DIAGNOSIS — J309 Allergic rhinitis, unspecified: Secondary | ICD-10-CM

## 2020-06-02 ENCOUNTER — Ambulatory Visit (INDEPENDENT_AMBULATORY_CARE_PROVIDER_SITE_OTHER): Payer: No Typology Code available for payment source

## 2020-06-02 DIAGNOSIS — J309 Allergic rhinitis, unspecified: Secondary | ICD-10-CM

## 2020-06-09 ENCOUNTER — Ambulatory Visit (INDEPENDENT_AMBULATORY_CARE_PROVIDER_SITE_OTHER): Payer: No Typology Code available for payment source

## 2020-06-09 DIAGNOSIS — J309 Allergic rhinitis, unspecified: Secondary | ICD-10-CM

## 2020-06-30 ENCOUNTER — Ambulatory Visit (INDEPENDENT_AMBULATORY_CARE_PROVIDER_SITE_OTHER): Payer: No Typology Code available for payment source

## 2020-06-30 DIAGNOSIS — J309 Allergic rhinitis, unspecified: Secondary | ICD-10-CM

## 2020-07-03 ENCOUNTER — Telehealth: Payer: No Typology Code available for payment source | Admitting: Physician Assistant

## 2020-07-03 ENCOUNTER — Telehealth: Payer: Self-pay | Admitting: Allergy & Immunology

## 2020-07-03 DIAGNOSIS — L03116 Cellulitis of left lower limb: Secondary | ICD-10-CM

## 2020-07-03 MED ORDER — SULFAMETHOXAZOLE-TRIMETHOPRIM 800-160 MG PO TABS: 1.0000 | ORAL_TABLET | Freq: Two times a day (BID) | ORAL | 0 refills | Status: DC

## 2020-07-03 MED ORDER — PREDNISONE 10 MG PO TABS: ORAL_TABLET | ORAL | 0 refills | Status: DC

## 2020-07-03 NOTE — Telephone Encounter (Signed)
Patient called reporting a severe insect bite on her left leg. She thinks that she was stung yesterday, but the swelling appeared acutely overnight. She has take NSAIDs and antihistamines this morning with minimal improvement. She denies fever, but the area is very warm. There is no discharge.  I recommended adding on prednisone 30mg  BID for five days or until cleared. I also recommended being aggressive with the antihistamine dosing and adding on cool compresses. Patient is going to update me over the weekend.    , MD [facility]

## 2020-07-03 NOTE — Progress Notes (Signed)
E Visit for Cellulitis  We are sorry that you are not feeling well. Here is how we plan to help!  Based on what you shared with me it looks like you have cellulitis.  Cellulitis looks like areas of skin redness, swelling, and warmth; it develops as a result of bacteria entering under the skin. Little red spots and/or bleeding can be seen in skin, and tiny surface sacs containing fluid can occur. Fever can be present. Cellulitis is almost always on one side of a body, and the lower limbs are the most common site of involvement.   I have prescribed:  Bactrim DS 1 tablet by mouth twice a day for 7 days  HOME CARE:  . Take your medications as ordered and take all of them, even if the skin irritation appears to be healing.   GET HELP RIGHT AWAY IF:  . Symptoms that don't begin to go away within 48 hours. . Severe redness persists or worsens . If the area turns color, spreads or swells. . If it blisters and opens, develops yellow-brown crust or bleeds. . You develop a fever or chills. . If the pain increases or becomes unbearable.  . Are unable to keep fluids and food down.  MAKE SURE YOU    Understand these instructions.  Will watch your condition.  Will get help right away if you are not doing well or get worse.  Thank you for choosing an e-visit. Your e-visit answers were reviewed by a board certified advanced clinical practitioner to complete your personal care plan. Depending upon the condition, your plan could have included both over the counter or prescription medications. Please review your pharmacy choice. Make sure the pharmacy is open so you can pick up prescription now. If there is a problem, you may contact your provider through [employer] and have the prescription routed to another pharmacy. Your safety is important to [place]. If you have drug allergies check your prescription carefully.  For the next 24 hours you can use MyChart to ask questions about today's visit,  request a non-urgent call back, or ask for a work or school excuse. You will get an email in the next two days asking about your experience. I hope that your e-visit has been valuable and will speed your recovery. Greater than 5 minutes, yet less than 10 minutes of time have been spent researching, coordinating, and implementing care for this patient today

## 2020-07-21 ENCOUNTER — Ambulatory Visit (INDEPENDENT_AMBULATORY_CARE_PROVIDER_SITE_OTHER): Payer: No Typology Code available for payment source

## 2020-07-21 DIAGNOSIS — J309 Allergic rhinitis, unspecified: Secondary | ICD-10-CM

## 2020-08-11 ENCOUNTER — Ambulatory Visit (INDEPENDENT_AMBULATORY_CARE_PROVIDER_SITE_OTHER): Payer: No Typology Code available for payment source

## 2020-08-11 DIAGNOSIS — J309 Allergic rhinitis, unspecified: Secondary | ICD-10-CM

## 2020-09-01 ENCOUNTER — Ambulatory Visit (INDEPENDENT_AMBULATORY_CARE_PROVIDER_SITE_OTHER): Payer: No Typology Code available for payment source | Admitting: *Deleted

## 2020-09-01 DIAGNOSIS — J309 Allergic rhinitis, unspecified: Secondary | ICD-10-CM

## 2020-09-06 MED FILL — LEVOCETIRIZINE 5 MG TABLET: 5 | 30 days supply | Qty: 30 | Fill #1

## 2020-09-10 ENCOUNTER — Ambulatory Visit (INDEPENDENT_AMBULATORY_CARE_PROVIDER_SITE_OTHER): Payer: No Typology Code available for payment source | Admitting: *Deleted

## 2020-09-10 ENCOUNTER — Other Ambulatory Visit: Payer: Self-pay

## 2020-09-10 DIAGNOSIS — Z23 Encounter for immunization: Secondary | ICD-10-CM | POA: Diagnosis not present

## 2020-09-10 NOTE — Progress Notes (Signed)
   Covid-19 Vaccination Clinic  Name:  Deetta Siegmann    MRN: [id number] DOB: 1981-10-23  09/10/2020  Ms. Hansley was observed post Covid-19 immunization for 15 minutes without incident. She was provided with Vaccine Information Sheet and instruction to access the V-Safe system.   Ms. Losasso was instructed to call 911 with any severe reactions post vaccine: Marland Kitchen Difficulty breathing  . Swelling of face and throat  . A fast heartbeat  . A bad rash all over body  . Dizziness and weakness

## 2020-09-22 ENCOUNTER — Ambulatory Visit (INDEPENDENT_AMBULATORY_CARE_PROVIDER_SITE_OTHER): Payer: No Typology Code available for payment source | Admitting: *Deleted

## 2020-09-22 DIAGNOSIS — J309 Allergic rhinitis, unspecified: Secondary | ICD-10-CM

## 2020-10-05 DIAGNOSIS — J3081 Allergic rhinitis due to animal (cat) (dog) hair and dander: Secondary | ICD-10-CM

## 2020-10-05 NOTE — Progress Notes (Signed)
Vial exp 10-05-21 

## 2020-10-18 ENCOUNTER — Ambulatory Visit (INDEPENDENT_AMBULATORY_CARE_PROVIDER_SITE_OTHER): Payer: No Typology Code available for payment source | Admitting: *Deleted

## 2020-10-18 DIAGNOSIS — J309 Allergic rhinitis, unspecified: Secondary | ICD-10-CM

## 2020-10-29 MED FILL — LEVOCETIRIZINE 5 MG TABLET: 5 | 30 days supply | Qty: 30 | Fill #2

## 2020-11-10 ENCOUNTER — Ambulatory Visit (INDEPENDENT_AMBULATORY_CARE_PROVIDER_SITE_OTHER): Payer: No Typology Code available for payment source

## 2020-11-10 DIAGNOSIS — J309 Allergic rhinitis, unspecified: Secondary | ICD-10-CM | POA: Diagnosis not present

## 2020-12-01 ENCOUNTER — Ambulatory Visit (INDEPENDENT_AMBULATORY_CARE_PROVIDER_SITE_OTHER): Payer: No Typology Code available for payment source | Admitting: *Deleted

## 2020-12-01 DIAGNOSIS — J309 Allergic rhinitis, unspecified: Secondary | ICD-10-CM

## 2020-12-07 ENCOUNTER — Encounter: Payer: No Typology Code available for payment source | Admitting: Family Medicine

## 2020-12-08 ENCOUNTER — Ambulatory Visit (INDEPENDENT_AMBULATORY_CARE_PROVIDER_SITE_OTHER): Payer: No Typology Code available for payment source | Admitting: *Deleted

## 2020-12-08 DIAGNOSIS — J309 Allergic rhinitis, unspecified: Secondary | ICD-10-CM

## 2020-12-10 ENCOUNTER — Encounter: Payer: No Typology Code available for payment source | Admitting: Family Medicine

## 2020-12-13 ENCOUNTER — Encounter: Payer: Self-pay | Admitting: Family Medicine

## 2020-12-13 DIAGNOSIS — L309 Dermatitis, unspecified: Secondary | ICD-10-CM

## 2020-12-13 DIAGNOSIS — J301 Allergic rhinitis due to pollen: Secondary | ICD-10-CM

## 2020-12-20 ENCOUNTER — Ambulatory Visit (INDEPENDENT_AMBULATORY_CARE_PROVIDER_SITE_OTHER): Payer: No Typology Code available for payment source

## 2020-12-20 DIAGNOSIS — J309 Allergic rhinitis, unspecified: Secondary | ICD-10-CM

## 2021-01-07 ENCOUNTER — Other Ambulatory Visit: Payer: Self-pay

## 2021-01-07 ENCOUNTER — Ambulatory Visit (INDEPENDENT_AMBULATORY_CARE_PROVIDER_SITE_OTHER): Payer: No Typology Code available for payment source | Admitting: Family Medicine

## 2021-01-07 ENCOUNTER — Encounter: Payer: Self-pay | Admitting: Family Medicine

## 2021-01-07 VITALS — BP 120/80 | HR 87 | Temp 98.8°F | Resp 15 | Ht 61.0 in | Wt 121.4 lb

## 2021-01-07 DIAGNOSIS — Z0000 Encounter for general adult medical examination without abnormal findings: Secondary | ICD-10-CM | POA: Diagnosis not present

## 2021-01-07 DIAGNOSIS — E559 Vitamin D deficiency, unspecified: Secondary | ICD-10-CM

## 2021-01-07 DIAGNOSIS — E538 Deficiency of other specified B group vitamins: Secondary | ICD-10-CM

## 2021-01-07 NOTE — Patient Instructions (Signed)
Follow up in 1 year or as needed Keep up the good work on healthy diet and regular exercise- you look great! Call with any questions or concerns Stay Safe!  Stay Healthy!!

## 2021-01-07 NOTE — Assessment & Plan Note (Signed)
Pt's PE WNL.  UTD on pap, immunizations.  Check labs.  Anticipatory guidance provided.  

## 2021-01-07 NOTE — Progress Notes (Signed)
   Subjective:    Patient ID: Sarah [name], female    DOB: 09-Jan-1981, 40 y.o.   MRN: [id number]  HPI CPE- UTD on pap.  Too young for mammo or colonoscopy.  UTD on COVID, flu, Tdap.  Reviewed past medical, surgical, family and social histories.   Health Maintenance  Topic Date Due  . Hepatitis C Screening  Never done  . HIV Screening  Never done  . PAP SMEAR-Modifier  12/22/2022  . TETANUS/TDAP  11/26/2026  . INFLUENZA VACCINE  Completed  . COVID-19 Vaccine  Completed      Review of Systems Patient reports no vision/ hearing changes, adenopathy,fever, weight change,  persistant/recurrent hoarseness , swallowing issues, chest pain, palpitations, edema, persistant/recurrent cough, hemoptysis, dyspnea (rest/exertional/paroxysmal nocturnal), gastrointestinal bleeding (melena, rectal bleeding), abdominal pain, significant heartburn, bowel changes, GU symptoms (dysuria, hematuria, incontinence), Gyn symptoms (abnormal  bleeding, pain),  syncope, focal weakness, memory loss, numbness & tingling, skin/hair/nail changes, abnormal bruising or bleeding, anxiety, or depression.   This visit occurred during the SARS-CoV-2 public health emergency.  Safety protocols were in place, including screening questions prior to the visit, additional usage of staff PPE, and extensive cleaning of exam room while observing appropriate contact time as indicated for disinfecting solutions.       Objective:   Physical Exam General Appearance:    Alert, cooperative, no distress, appears stated age  Head:    Normocephalic, without obvious abnormality, atraumatic  Eyes:    PERRL, conjunctiva/corneas clear, EOM's intact, fundi    benign, both eyes  Ears:    Normal TM's and external ear canals, both ears  Nose:   Deferred due to COVID  Throat:   Neck:   Supple, symmetrical, trachea midline, no adenopathy;    Thyroid: no enlargement/tenderness/nodules  Back:     Symmetric, no curvature, ROM normal, no CVA tenderness   Lungs:     Clear to auscultation bilaterally, respirations unlabored  Chest Wall:    No tenderness or deformity   Heart:    Regular rate and rhythm, S1 and S2 normal, no murmur, rub   or gallop  Breast Exam:    Deferred  Abdomen:     Soft, non-tender, bowel sounds active all four quadrants,    no masses, no organomegaly  Genitalia:    Deferred  Rectal:    Extremities:   Extremities normal, atraumatic, no cyanosis or edema  Pulses:   2+ and symmetric all extremities  Skin:   Skin color, texture, turgor normal, no rashes or lesions  Lymph nodes:   Cervical, supraclavicular, and axillary nodes normal  Neurologic:   CNII-XII intact, normal strength, sensation and reflexes    throughout          Assessment & Plan:

## 2021-01-07 NOTE — Addendum Note (Signed)
Addended by: Vincenza Hews on: 01/07/2021 02:25 PM   Modules accepted: Orders

## 2021-01-17 ENCOUNTER — Ambulatory Visit (INDEPENDENT_AMBULATORY_CARE_PROVIDER_SITE_OTHER): Payer: No Typology Code available for payment source | Admitting: *Deleted

## 2021-01-17 DIAGNOSIS — J309 Allergic rhinitis, unspecified: Secondary | ICD-10-CM

## 2021-02-07 MED FILL — LEVOCETIRIZINE 5 MG TABLET: 5 | 30 days supply | Qty: 30 | Fill #3

## 2021-02-14 ENCOUNTER — Ambulatory Visit (INDEPENDENT_AMBULATORY_CARE_PROVIDER_SITE_OTHER): Payer: No Typology Code available for payment source

## 2021-02-14 DIAGNOSIS — J309 Allergic rhinitis, unspecified: Secondary | ICD-10-CM | POA: Diagnosis not present

## 2021-02-21 ENCOUNTER — Other Ambulatory Visit: Payer: Self-pay

## 2021-02-21 ENCOUNTER — Encounter: Payer: Self-pay | Admitting: Family Medicine

## 2021-02-21 ENCOUNTER — Other Ambulatory Visit (HOSPITAL_COMMUNITY): Payer: Self-pay

## 2021-02-21 MED ORDER — OLOPATADINE HCL 0.2 % OP SOLN
1.0000 [drp] | Freq: Every day | OPHTHALMIC | 0 refills | Status: DC | PRN
  Filled 2021-02-21: qty 2.5, 25d supply, fill #0

## 2021-02-21 MED ORDER — AZELASTINE-FLUTICASONE 137-50 MCG/ACT NA SUSP
1.0000 | Freq: Two times a day (BID) | NASAL | 0 refills | Status: DC
  Filled 2021-02-21: qty 23, 30d supply, fill #0

## 2021-02-21 NOTE — Addendum Note (Signed)
Addended by: Eldred Manges on: 02/21/2021 10:10 AM   Modules accepted: Orders

## 2021-02-24 ENCOUNTER — Other Ambulatory Visit: Payer: Self-pay | Admitting: Family Medicine

## 2021-02-24 ENCOUNTER — Other Ambulatory Visit: Payer: Self-pay

## 2021-02-24 DIAGNOSIS — E538 Deficiency of other specified B group vitamins: Secondary | ICD-10-CM

## 2021-02-24 DIAGNOSIS — E559 Vitamin D deficiency, unspecified: Secondary | ICD-10-CM

## 2021-02-25 LAB — B12 AND FOLATE PANEL
Folate: 14.9 ng/mL (ref >3.0)
Vitamin B-12: 484 pg/mL (ref 232–1245)

## 2021-02-25 LAB — VITAMIN D 25 HYDROXY (VIT D DEFICIENCY, FRACTURES): Vit D, 25-Hydroxy: 17.4 ng/mL — ABNORMAL LOW (ref 30.0–100.0)

## 2021-03-03 ENCOUNTER — Other Ambulatory Visit: Payer: Self-pay

## 2021-03-03 ENCOUNTER — Encounter: Payer: Self-pay | Admitting: Family Medicine

## 2021-03-03 ENCOUNTER — Other Ambulatory Visit (HOSPITAL_COMMUNITY): Payer: Self-pay

## 2021-03-03 DIAGNOSIS — E559 Vitamin D deficiency, unspecified: Secondary | ICD-10-CM

## 2021-03-03 MED ORDER — VITAMIN D (ERGOCALCIFEROL) 1.25 MG (50000 UNIT) PO CAPS
50000.0000 [IU] | ORAL_CAPSULE | ORAL | 0 refills | Status: DC
  Filled 2021-03-03: qty 12, 84d supply, fill #0

## 2021-03-03 NOTE — Telephone Encounter (Signed)
Patient reviewed the results you sent about vitamin D and was sending the pharmacy that she would like the prescription to be sent to, and was also wondering when to come back to get rechecked.

## 2021-03-14 ENCOUNTER — Ambulatory Visit (INDEPENDENT_AMBULATORY_CARE_PROVIDER_SITE_OTHER): Payer: No Typology Code available for payment source | Admitting: *Deleted

## 2021-03-14 DIAGNOSIS — J309 Allergic rhinitis, unspecified: Secondary | ICD-10-CM

## 2021-04-05 ENCOUNTER — Encounter: Payer: Self-pay | Admitting: Family Medicine

## 2021-04-11 ENCOUNTER — Ambulatory Visit (INDEPENDENT_AMBULATORY_CARE_PROVIDER_SITE_OTHER): Payer: No Typology Code available for payment source

## 2021-04-11 DIAGNOSIS — J309 Allergic rhinitis, unspecified: Secondary | ICD-10-CM | POA: Diagnosis not present

## 2021-05-11 ENCOUNTER — Ambulatory Visit (INDEPENDENT_AMBULATORY_CARE_PROVIDER_SITE_OTHER): Payer: No Typology Code available for payment source

## 2021-05-11 DIAGNOSIS — J309 Allergic rhinitis, unspecified: Secondary | ICD-10-CM | POA: Diagnosis not present

## 2021-05-18 ENCOUNTER — Encounter: Payer: Self-pay | Admitting: *Deleted

## 2021-05-31 DIAGNOSIS — J3081 Allergic rhinitis due to animal (cat) (dog) hair and dander: Secondary | ICD-10-CM

## 2021-05-31 NOTE — Progress Notes (Signed)
VIAL MADE. EXP 05-31-22 

## 2021-06-06 ENCOUNTER — Ambulatory Visit (INDEPENDENT_AMBULATORY_CARE_PROVIDER_SITE_OTHER): Payer: No Typology Code available for payment source | Admitting: *Deleted

## 2021-06-06 DIAGNOSIS — J309 Allergic rhinitis, unspecified: Secondary | ICD-10-CM

## 2021-06-24 ENCOUNTER — Other Ambulatory Visit: Payer: Self-pay | Admitting: *Deleted

## 2021-06-24 ENCOUNTER — Other Ambulatory Visit (HOSPITAL_COMMUNITY): Payer: Self-pay

## 2021-06-24 MED ORDER — AZELASTINE-FLUTICASONE 137-50 MCG/ACT NA SUSP
1.0000 | Freq: Two times a day (BID) | NASAL | 5 refills | Status: DC
  Filled 2021-06-24 – 2021-07-01 (×3): qty 23, 30d supply, fill #0
  Filled 2021-08-07 – 2021-08-17 (×2): qty 23, 30d supply, fill #1
  Filled 2021-11-10: qty 23, 30d supply, fill #2
  Filled 2021-12-16: qty 23, 30d supply, fill #3

## 2021-06-29 ENCOUNTER — Other Ambulatory Visit (HOSPITAL_COMMUNITY): Payer: Self-pay

## 2021-06-29 ENCOUNTER — Other Ambulatory Visit: Payer: Self-pay | Admitting: Family Medicine

## 2021-06-29 ENCOUNTER — Telehealth: Payer: Self-pay | Admitting: *Deleted

## 2021-06-29 MED ORDER — FLUCONAZOLE 150 MG PO TABS
150.0000 mg | ORAL_TABLET | Freq: Every day | ORAL | 0 refills | Status: DC
  Filled 2021-06-29: qty 2, 2d supply, fill #0

## 2021-06-29 NOTE — Telephone Encounter (Signed)
PA has been submitted through CoverMy[employer]eds for Azelastine and Fluticasone and is currently pending approval/denial.

## 2021-06-30 NOTE — Telephone Encounter (Signed)
PA has been approved for Azelastine-Fluticasone. PA has been faxed to patients pharmacy, labeled, and placed in bulk scanning.  

## 2021-07-01 ENCOUNTER — Other Ambulatory Visit (HOSPITAL_COMMUNITY): Payer: Self-pay

## 2021-07-01 NOTE — Telephone Encounter (Signed)
Patient has been informed.

## 2021-07-06 ENCOUNTER — Ambulatory Visit (INDEPENDENT_AMBULATORY_CARE_PROVIDER_SITE_OTHER): Payer: No Typology Code available for payment source

## 2021-07-06 DIAGNOSIS — J309 Allergic rhinitis, unspecified: Secondary | ICD-10-CM | POA: Diagnosis not present

## 2021-08-03 ENCOUNTER — Ambulatory Visit (INDEPENDENT_AMBULATORY_CARE_PROVIDER_SITE_OTHER): Payer: No Typology Code available for payment source

## 2021-08-03 DIAGNOSIS — J309 Allergic rhinitis, unspecified: Secondary | ICD-10-CM

## 2021-08-08 ENCOUNTER — Ambulatory Visit (INDEPENDENT_AMBULATORY_CARE_PROVIDER_SITE_OTHER): Payer: No Typology Code available for payment source | Admitting: *Deleted

## 2021-08-08 ENCOUNTER — Other Ambulatory Visit (HOSPITAL_COMMUNITY): Payer: Self-pay

## 2021-08-08 DIAGNOSIS — J309 Allergic rhinitis, unspecified: Secondary | ICD-10-CM

## 2021-08-16 ENCOUNTER — Other Ambulatory Visit (HOSPITAL_COMMUNITY): Payer: Self-pay

## 2021-08-17 ENCOUNTER — Ambulatory Visit (INDEPENDENT_AMBULATORY_CARE_PROVIDER_SITE_OTHER): Payer: No Typology Code available for payment source | Admitting: *Deleted

## 2021-08-17 ENCOUNTER — Other Ambulatory Visit (HOSPITAL_COMMUNITY): Payer: Self-pay

## 2021-08-17 DIAGNOSIS — J309 Allergic rhinitis, unspecified: Secondary | ICD-10-CM

## 2021-09-08 ENCOUNTER — Other Ambulatory Visit (HOSPITAL_COMMUNITY): Payer: Self-pay

## 2021-09-12 ENCOUNTER — Ambulatory Visit (INDEPENDENT_AMBULATORY_CARE_PROVIDER_SITE_OTHER): Payer: No Typology Code available for payment source

## 2021-09-12 DIAGNOSIS — J309 Allergic rhinitis, unspecified: Secondary | ICD-10-CM | POA: Diagnosis not present

## 2021-09-28 ENCOUNTER — Other Ambulatory Visit (HOSPITAL_COMMUNITY): Payer: Self-pay

## 2021-09-28 ENCOUNTER — Telehealth: Payer: Self-pay | Admitting: Allergy & Immunology

## 2021-09-28 MED ORDER — DOXYCYCLINE MONOHYDRATE 100 MG PO TABS
100.0000 mg | ORAL_TABLET | Freq: Two times a day (BID) | ORAL | 0 refills | Status: AC
  Filled 2021-09-28: qty 20, 10d supply, fill #0

## 2021-09-28 NOTE — Telephone Encounter (Signed)
Patient called reporting well over one week of sinusitis and nasal pressure. Because of no improvement with OTC medications, we are going to start doxycycline 100mg  BID for ten days. Sent in a course of .   [employer], MD [facility]

## 2021-10-10 ENCOUNTER — Ambulatory Visit (INDEPENDENT_AMBULATORY_CARE_PROVIDER_SITE_OTHER): Payer: No Typology Code available for payment source | Admitting: *Deleted

## 2021-10-10 DIAGNOSIS — J309 Allergic rhinitis, unspecified: Secondary | ICD-10-CM | POA: Diagnosis not present

## 2021-11-07 ENCOUNTER — Ambulatory Visit (INDEPENDENT_AMBULATORY_CARE_PROVIDER_SITE_OTHER): Payer: No Typology Code available for payment source

## 2021-11-07 DIAGNOSIS — J309 Allergic rhinitis, unspecified: Secondary | ICD-10-CM

## 2021-11-10 ENCOUNTER — Other Ambulatory Visit (HOSPITAL_COMMUNITY): Payer: Self-pay

## 2021-12-05 ENCOUNTER — Ambulatory Visit (INDEPENDENT_AMBULATORY_CARE_PROVIDER_SITE_OTHER): Payer: No Typology Code available for payment source

## 2021-12-05 DIAGNOSIS — J309 Allergic rhinitis, unspecified: Secondary | ICD-10-CM

## 2021-12-13 ENCOUNTER — Other Ambulatory Visit (HOSPITAL_COMMUNITY): Payer: Self-pay

## 2021-12-13 ENCOUNTER — Other Ambulatory Visit: Payer: Self-pay | Admitting: Allergy

## 2021-12-13 MED ORDER — OLOPATADINE HCL 0.2 % OP SOLN
1.0000 [drp] | Freq: Every day | OPHTHALMIC | 0 refills | Status: DC | PRN
Start: 2021-12-13 — End: 2022-01-13
  Filled 2021-12-13: qty 2.5, 25d supply, fill #0

## 2021-12-16 ENCOUNTER — Other Ambulatory Visit (HOSPITAL_COMMUNITY): Payer: Self-pay

## 2022-01-02 ENCOUNTER — Ambulatory Visit (INDEPENDENT_AMBULATORY_CARE_PROVIDER_SITE_OTHER): Payer: No Typology Code available for payment source

## 2022-01-02 DIAGNOSIS — J309 Allergic rhinitis, unspecified: Secondary | ICD-10-CM

## 2022-01-04 DIAGNOSIS — J3081 Allergic rhinitis due to animal (cat) (dog) hair and dander: Secondary | ICD-10-CM | POA: Diagnosis not present

## 2022-01-04 NOTE — Progress Notes (Signed)
VIALS EXP 01-04-23 °

## 2022-01-10 ENCOUNTER — Other Ambulatory Visit: Payer: Self-pay

## 2022-01-10 MED ORDER — RYALTRIS 665-25 MCG/ACT NA SUSP: 2.0000 | Freq: Two times a day (BID) | NASAL | 5 refills | Status: DC

## 2022-01-13 ENCOUNTER — Ambulatory Visit (INDEPENDENT_AMBULATORY_CARE_PROVIDER_SITE_OTHER): Payer: No Typology Code available for payment source | Admitting: Family Medicine

## 2022-01-13 ENCOUNTER — Encounter: Payer: Self-pay | Admitting: Family Medicine

## 2022-01-13 VITALS — BP 112/74 | HR 70 | Temp 98.0°F | Resp 16 | Ht 61.0 in | Wt 124.0 lb

## 2022-01-13 DIAGNOSIS — Z0000 Encounter for general adult medical examination without abnormal findings: Secondary | ICD-10-CM | POA: Diagnosis not present

## 2022-01-13 DIAGNOSIS — Z1231 Encounter for screening mammogram for malignant neoplasm of breast: Secondary | ICD-10-CM | POA: Diagnosis not present

## 2022-01-13 DIAGNOSIS — Z124 Encounter for screening for malignant neoplasm of cervix: Secondary | ICD-10-CM

## 2022-01-13 DIAGNOSIS — E559 Vitamin D deficiency, unspecified: Secondary | ICD-10-CM | POA: Diagnosis not present

## 2022-01-13 NOTE — Assessment & Plan Note (Signed)
Pt's PE WNL.  UTD on pap, immunizations.  Will order mammo as she is turning 41.  She will get labs done on Doctor's Day and forward them to me.  Anticipatory guidance provided.

## 2022-01-13 NOTE — Patient Instructions (Addendum)
Follow up in 1 year or as needed Forward your doctor's day labs to me so I can update the chart Keep up the good work!  You look great!!! They should call you to schedule your mammogram once you turn 41 Call with any questions or concerns Stay Safe!  Stay Healthy!! HAPPY EARLY BIRTHDAY!!!

## 2022-01-13 NOTE — Progress Notes (Signed)
° °  Subjective:    Patient ID: Sarah [name], female    DOB: 03/06/1981, 41 y.o.   MRN: [id number]  HPI CPE- UTD on pap, Tdap, flu.  No concerns today.  Health Maintenance  Topic Date Due   HIV Screening  Never done   Hepatitis C Screening  Never done   COVID-19 Vaccine (4 - Booster for Pfizer series) 11/05/2020   PAP SMEAR-Modifier  12/22/2022   TETANUS/TDAP  11/26/2026   INFLUENZA VACCINE  Completed   HPV VACCINES  Aged Out      Review of Systems Patient reports no vision/ hearing changes, adenopathy,fever, weight change,  persistant/recurrent hoarseness , swallowing issues, chest pain, palpitations, edema, persistant/recurrent cough, hemoptysis, dyspnea (rest/exertional/paroxysmal nocturnal), gastrointestinal bleeding (melena, rectal bleeding), abdominal pain, significant heartburn, bowel changes, GU symptoms (dysuria, hematuria, incontinence), Gyn symptoms (abnormal  bleeding, pain),  syncope, focal weakness, memory loss, numbness & tingling, skin/hair/nail changes, abnormal bruising or bleeding, anxiety, or depression.   This visit occurred during the SARS-CoV-2 public health emergency.  Safety protocols were in place, including screening questions prior to the visit, additional usage of staff PPE, and extensive cleaning of exam room while observing appropriate contact time as indicated for disinfecting solutions.      Objective:   Physical Exam General Appearance:    Alert, cooperative, no distress, appears stated age  Head:    Normocephalic, without obvious abnormality, atraumatic  Eyes:    PERRL, conjunctiva/corneas clear, EOM's intact, fundi    benign, both eyes  Ears:    Normal TM's and external ear canals, both ears  Nose:   Deferred due to COVID  Throat:   Neck:   Supple, symmetrical, trachea midline, no adenopathy;    Thyroid: no enlargement/tenderness/nodules  Back:     Symmetric, no curvature, ROM normal, no CVA tenderness  Lungs:     Clear to auscultation bilaterally,  respirations unlabored  Chest Wall:    No tenderness or deformity   Heart:    Regular rate and rhythm, S1 and S2 normal, no murmur, rub   or gallop  Breast Exam:    Deferred to GYN  Abdomen:     Soft, non-tender, bowel sounds active all four quadrants,    no masses, no organomegaly  Genitalia:    Deferred to GYN  Rectal:    Extremities:   Extremities normal, atraumatic, no cyanosis or edema  Pulses:   2+ and symmetric all extremities  Skin:   Skin color, texture, turgor normal, no rashes or lesions  Lymph nodes:   Cervical, supraclavicular, and axillary nodes normal  Neurologic:   CNII-XII intact, normal strength, sensation and reflexes    throughout          Assessment & Plan:

## 2022-01-30 ENCOUNTER — Ambulatory Visit (INDEPENDENT_AMBULATORY_CARE_PROVIDER_SITE_OTHER): Payer: No Typology Code available for payment source

## 2022-01-30 DIAGNOSIS — J309 Allergic rhinitis, unspecified: Secondary | ICD-10-CM

## 2022-02-13 ENCOUNTER — Encounter: Payer: Self-pay | Admitting: Family Medicine

## 2022-02-13 DIAGNOSIS — E559 Vitamin D deficiency, unspecified: Secondary | ICD-10-CM

## 2022-02-15 NOTE — Addendum Note (Signed)
Addended by: Tawnya Crook on: 02/15/2022 08:20 AM ? ? Modules accepted: Orders ? ?

## 2022-02-18 LAB — VITAMIN D 25 HYDROXY (VIT D DEFICIENCY, FRACTURES): Vit D, 25-Hydroxy: 29 ng/mL — ABNORMAL LOW (ref 30.0–100.0)

## 2022-03-06 ENCOUNTER — Ambulatory Visit (INDEPENDENT_AMBULATORY_CARE_PROVIDER_SITE_OTHER): Payer: No Typology Code available for payment source | Admitting: *Deleted

## 2022-03-06 ENCOUNTER — Encounter: Payer: Self-pay | Admitting: Family Medicine

## 2022-03-06 DIAGNOSIS — J309 Allergic rhinitis, unspecified: Secondary | ICD-10-CM | POA: Diagnosis not present

## 2022-03-17 ENCOUNTER — Ambulatory Visit
Admission: RE | Admit: 2022-03-17 | Discharge: 2022-03-17 | Disposition: A | Discharge status: Home / Self Care | Payer: No Typology Code available for payment source | Source: Ambulatory Visit | Attending: Family Medicine | Admitting: Family Medicine

## 2022-03-17 DIAGNOSIS — Z1231 Encounter for screening mammogram for malignant neoplasm of breast: Secondary | ICD-10-CM

## 2022-04-03 ENCOUNTER — Ambulatory Visit (INDEPENDENT_AMBULATORY_CARE_PROVIDER_SITE_OTHER): Payer: No Typology Code available for payment source

## 2022-04-03 DIAGNOSIS — J309 Allergic rhinitis, unspecified: Secondary | ICD-10-CM | POA: Diagnosis not present

## 2022-04-06 NOTE — Progress Notes (Signed)
[name] [address] [place] [address] Phone: [phone] Subjective:   INadine Counts, am serving as a scribe for Dr. Antoine Primas. This visit occurred during the SARS-CoV-2 public health emergency.  Safety protocols were in place, including screening questions prior to the visit, additional usage of staff PPE, and extensive cleaning of exam room while observing appropriate contact time as indicated for disinfecting solutions.   I'm seeing this patient by the request  of:  Sheliah Hatch, MD  CC: Low back pain, leg pain  FVC:[id number]  Zela Sobieski is a 41 y.o. female coming in with complaint of lumbar spine pain. Patient states left leg numbness and tingling. Sporadic, no pain. Has been happening for years. Moving around helps and exercises from PT helps. No other complaints.  Has been in physical therapy previously.  Patient states that she does more activity seems to be better.  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   Past Surgical History:  Procedure Laterality Date   TONSILLECTOMY AND ADENOIDECTOMY  1993   Social History   Socioeconomic History   Marital status: Married    Spouse name: Not on file   Number of children: Not on file   Years of education: Not on file   Highest education level: Not on file  Occupational History   Not on file  Tobacco Use   Smoking status: Never   Smokeless tobacco: Never  Vaping Use   Vaping Use: Never used  Substance and Sexual Activity   Alcohol use: Yes   Drug use: Not Currently   Sexual activity: Yes  Other Topics Concern   Not on file  Social History Narrative   Not on file   Social Determinants of Health   Financial Resource Strain: Not on file  Food  Insecurity: Not on file  Transportation Needs: Not on file  Physical Activity: Not on file  Stress: Not on file  Social Connections: Not on file   No Known Allergies Family History  Problem Relation Age of Onset   Hypertension Mother    Polycythemia Mother    Allergic rhinitis Father    Healthy Daughter    Dementia Maternal Grandmother    Hyperlipidemia Maternal Grandfather    Hypertension Maternal Grandfather    Stroke Maternal Grandfather    Arthritis Paternal Grandmother    Hearing loss Paternal Grandmother    Healthy Brother    Allergic rhinitis Brother    Food Allergy Brother    Asthma Neg Hx    Angioedema Neg Hx    Eczema Neg Hx    Urticaria Neg Hx    Breast cancer Neg Hx          Current Outpatient Medications (Other):    Multiple Vitamin (MULTIVITAMIN) tablet, Take 1 tablet by mouth daily.   Reviewed prior external information including notes and imaging from  primary care provider As well as notes that were available from care everywhere and other healthcare systems.  Past medical history, social, surgical and family history all reviewed in electronic medical record.  No pertanent information unless stated regarding to the chief complaint.   Review of Systems:  No headache, visual changes, nausea, vomiting,  diarrhea, constipation, dizziness, abdominal pain, skin rash, fevers, chills, night sweats, weight loss, swollen lymph nodes, body aches, joint swelling, chest pain, shortness of breath, mood changes. POSITIVE muscle aches  Objective  Blood pressure 120/84, pulse 68, height 5\' 1"  (1.549 m), weight 124 lb (56.2 kg), SpO2 99 %.   General: No apparent distress alert and oriented x3 mood and affect normal, dressed appropriately.  HEENT: Pupils equal, extraocular movements intact  Respiratory: Patient's speak in full sentences and does not appear short of breath  Cardiovascular: No lower extremity edema, non tender, no erythema  Gait normal with good  balance and coordination.  MSK: Back exam does have some mild loss of lordosis.  Tender to palpation over the sacroiliac joint on the left side.  Patient does have significant tightness of the FABER test on the.  Tenderness over the piriformis as well.  Negative straight leg test but does have some mild radicular symptoms with palpation in the piriformis.  No weakness of the lower extremity and neurovascular intact  Osteopathic findings  T4 extended rotated and side bent right inhaled third rib T9 extended rotated and side bent left L3 flexed rotated and side bent right Sacrum left on left     Impression and Recommendations:     The above documentation has been reviewed and is accurate and complete , DO

## 2022-04-07 ENCOUNTER — Ambulatory Visit (INDEPENDENT_AMBULATORY_CARE_PROVIDER_SITE_OTHER): Payer: No Typology Code available for payment source

## 2022-04-07 ENCOUNTER — Ambulatory Visit: Payer: No Typology Code available for payment source | Admitting: Family Medicine

## 2022-04-07 VITALS — BP 120/84 | HR 68 | Ht 61.0 in | Wt 124.0 lb

## 2022-04-07 DIAGNOSIS — G5702 Lesion of sciatic nerve, left lower limb: Secondary | ICD-10-CM | POA: Diagnosis not present

## 2022-04-07 DIAGNOSIS — M9902 Segmental and somatic dysfunction of thoracic region: Secondary | ICD-10-CM | POA: Diagnosis not present

## 2022-04-07 DIAGNOSIS — M9903 Segmental and somatic dysfunction of lumbar region: Secondary | ICD-10-CM

## 2022-04-07 DIAGNOSIS — M9904 Segmental and somatic dysfunction of sacral region: Secondary | ICD-10-CM | POA: Diagnosis not present

## 2022-04-07 NOTE — Assessment & Plan Note (Signed)
Fairly significant piriformis syndrome.  Patient does have tightness noted.  We will get x-rays to make sure there is no intra-articular back pathology that could be contributing.  Responded extremely well though to osteopathic manipulation today.  Discussed icing regimen and home exercises.  Increase activity slowly and follow-up with me again in 5 to 6 weeks.  At that point likely will be able to start spacing out, if worsening pain will consider the possibility of physical therapy and injections but highly unlikely

## 2022-04-07 NOTE — Assessment & Plan Note (Signed)
   Decision today to treat with OMT was based on Physical Exam  After verbal consent patient was treated with HVLA, ME, FPR techniques in  thoracic, rib, lumbar and sacral areas, all areas are chronic   Patient tolerated the procedure well with improvement in symptoms  Patient given exercises, stretches and lifestyle modifications  See medications in patient instructions if given  Patient will follow up in 6-8 weeks

## 2022-04-07 NOTE — Patient Instructions (Addendum)
Do prescribed exercises at least 3x a week Tennis ball in back left pocket when sitting Xray today See you again in 5-6 weeks

## 2022-04-10 ENCOUNTER — Ambulatory Visit (INDEPENDENT_AMBULATORY_CARE_PROVIDER_SITE_OTHER): Payer: No Typology Code available for payment source

## 2022-04-10 DIAGNOSIS — J309 Allergic rhinitis, unspecified: Secondary | ICD-10-CM

## 2022-04-21 ENCOUNTER — Ambulatory Visit (INDEPENDENT_AMBULATORY_CARE_PROVIDER_SITE_OTHER): Payer: No Typology Code available for payment source

## 2022-04-21 DIAGNOSIS — J309 Allergic rhinitis, unspecified: Secondary | ICD-10-CM

## 2022-05-01 ENCOUNTER — Ambulatory Visit (INDEPENDENT_AMBULATORY_CARE_PROVIDER_SITE_OTHER): Payer: No Typology Code available for payment source

## 2022-05-01 DIAGNOSIS — J309 Allergic rhinitis, unspecified: Secondary | ICD-10-CM | POA: Diagnosis not present

## 2022-05-18 NOTE — Progress Notes (Signed)
  [name] [address] [place] [address] Phone: [phone] Subjective:   Bruce Donath, am serving as a scribe for Dr. Antoine Primas.   I'm seeing this patient by the request  of:  Sheliah Hatch, MD  CC: Low back and buttocks pain follow-up  NOI:[id number]  Sarah [name] is a 41 y.o. female coming in with complaint of back and neck pain. OMT 04/07/2022. Patient states that her leg pain is less than last visit. Does feel that manipulation helps.  Patient is making progress.  Still only doing the exercises intermittently.  Has not needed to take anything for pain.  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   General: No apparent distress alert and oriented x3 mood and affect normal, dressed appropriately.  HEENT: Pupils equal, extraocular movements intact  Respiratory: Patient's speak in full sentences and does not appear short of breath  Cardiovascular: No lower extremity edema, non tender, no erythema  Gait MSK:  Back tender to palpation in the paraspinal musculature of the lumbar spine bilaterally left greater than right.  Patient does have mild tightness noted with FABER test on the left greater than right as well.  Negative straight leg test.  Osteopathic findings  T9 extended rotated and side bent left L2 flexed rotated and side bent right Sacrum left on left       Assessment and Plan:  Piriformis syndrome of left side Patient is making good progress at the moment.  Still has the piriformis mostly seems to be on the left side.  Seems to be more sacroiliac joint as well.   Follow-up with me again in 8 weeks.    Nonallopathic problems  Decision today to treat with OMT was based on Physical Exam  After verbal consent patient was treated with HVLA, ME, FPR techniques in  thoracic, lumbar, and sacral  areas  Patient tolerated the procedure well with improvement in symptoms  Patient given exercises, stretches and lifestyle modifications  See medications in patient instructions if given  Patient will follow up in 4-8 weeks    The above documentation has been reviewed and is accurate and complete , DO          Note: This dictation was prepared with Dragon dictation along with smaller phrase technology. Any transcriptional errors that result from this process are unintentional.

## 2022-05-19 ENCOUNTER — Encounter: Payer: Self-pay | Admitting: Family Medicine

## 2022-05-19 ENCOUNTER — Ambulatory Visit (INDEPENDENT_AMBULATORY_CARE_PROVIDER_SITE_OTHER): Payer: No Typology Code available for payment source | Admitting: Family Medicine

## 2022-05-19 VITALS — BP 120/82 | HR 75 | Ht 61.0 in | Wt 126.0 lb

## 2022-05-19 DIAGNOSIS — M9904 Segmental and somatic dysfunction of sacral region: Secondary | ICD-10-CM | POA: Diagnosis not present

## 2022-05-19 DIAGNOSIS — G5702 Lesion of sciatic nerve, left lower limb: Secondary | ICD-10-CM | POA: Diagnosis not present

## 2022-05-19 DIAGNOSIS — M9902 Segmental and somatic dysfunction of thoracic region: Secondary | ICD-10-CM

## 2022-05-19 DIAGNOSIS — M9903 Segmental and somatic dysfunction of lumbar region: Secondary | ICD-10-CM

## 2022-05-19 NOTE — Assessment & Plan Note (Signed)
Patient is making good progress at the moment.  Still has the piriformis mostly seems to be on the left side.  Seems to be more sacroiliac joint as well.  Follow-up with me again in 8 weeks.

## 2022-05-19 NOTE — Patient Instructions (Signed)
Good to see you Posture exercises You are making progress slowly See me in 7-8 weeks

## 2022-05-29 ENCOUNTER — Ambulatory Visit (INDEPENDENT_AMBULATORY_CARE_PROVIDER_SITE_OTHER): Payer: No Typology Code available for payment source | Admitting: *Deleted

## 2022-05-29 DIAGNOSIS — J309 Allergic rhinitis, unspecified: Secondary | ICD-10-CM | POA: Diagnosis not present

## 2022-06-26 ENCOUNTER — Ambulatory Visit (INDEPENDENT_AMBULATORY_CARE_PROVIDER_SITE_OTHER): Payer: No Typology Code available for payment source

## 2022-06-26 DIAGNOSIS — J309 Allergic rhinitis, unspecified: Secondary | ICD-10-CM

## 2022-07-13 NOTE — Progress Notes (Signed)
  [name] [address] [place] [address] Phone: [phone] Subjective:   Sarah [name], am serving as a scribe for Dr. Antoine Primas.  Sarah'm seeing this patient by the request  of:  Sheliah Hatch, MD  CC: Neck and back pain follow-up  [id number]  Sarah [name] is a 41 y.o. female coming in with complaint of back and neck pain. OMT 05/19/2022. Patient states doing well. Discomfort is sporadic. No new issues.  Has been doing relatively well. Tightness noted in the paraspinal musculature and will follow still.  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   General: No apparent distress alert and oriented x3 mood and affect normal, dressed appropriately.  HEENT: Pupils equal, extraocular movements intact  Respiratory: Patient's speak in full sentences and does not appear short of breath  Cardiovascular: No lower extremity edema, non tender, no erythema  Gait MSK:  Back does have some loss of lordosis.  Tenderness more noted over this left sacroiliac joint.  Tightness of the FABER test noted.  Osteopathic findings  C3 flexed rotated and side bent right C6 flexed rotated and side bent left T6 extended rotated and side bent right with inhaled rib L5 flexed rotated and side bent right Sacrum left on left    Assessment and Plan:  Piriformis syndrome of left side Still have mild tightness but is doing significantly better.  Does have a very small amount of possible tightness in the left side of the paraspinal musculature.  Discussed icing  regimen and home exercises, discussed which activities to do and which ones to avoid, increase activity slowly otherwise.  Follow-up in 6 to 8 weeks    Nonallopathic problems  Decision today to treat with OMT was based on Physical Exam  After verbal consent patient was treated with HVLA, ME, FPR techniques in cervical, rib, thoracic, lumbar, and sacral  areas  Patient tolerated the procedure well with improvement in symptoms  Patient given exercises, stretches and lifestyle modifications  See medications in patient instructions if given  Patient will follow up in 4-8 weeks             Note: This dictation was prepared with Dragon dictation along with smaller phrase technology. Any transcriptional errors that result from this process are unintentional.

## 2022-07-14 ENCOUNTER — Encounter: Payer: Self-pay | Admitting: Family Medicine

## 2022-07-14 ENCOUNTER — Ambulatory Visit: Payer: No Typology Code available for payment source | Admitting: Family Medicine

## 2022-07-14 VITALS — BP 110/80 | HR 78 | Ht 61.0 in | Wt 124.0 lb

## 2022-07-14 DIAGNOSIS — M9908 Segmental and somatic dysfunction of rib cage: Secondary | ICD-10-CM | POA: Diagnosis not present

## 2022-07-14 DIAGNOSIS — M9901 Segmental and somatic dysfunction of cervical region: Secondary | ICD-10-CM

## 2022-07-14 DIAGNOSIS — M9902 Segmental and somatic dysfunction of thoracic region: Secondary | ICD-10-CM | POA: Diagnosis not present

## 2022-07-14 DIAGNOSIS — M9903 Segmental and somatic dysfunction of lumbar region: Secondary | ICD-10-CM | POA: Diagnosis not present

## 2022-07-14 DIAGNOSIS — G5702 Lesion of sciatic nerve, left lower limb: Secondary | ICD-10-CM

## 2022-07-14 DIAGNOSIS — M9904 Segmental and somatic dysfunction of sacral region: Secondary | ICD-10-CM | POA: Diagnosis not present

## 2022-07-14 NOTE — Patient Instructions (Signed)
Good to see you! Good luck with building office

## 2022-07-14 NOTE — Assessment & Plan Note (Signed)
Still have mild tightness but is doing significantly better.  Does have a very small amount of possible tightness in the left side of the paraspinal musculature.  Discussed icing regimen and home exercises, discussed which activities to do and which ones to avoid, increase activity slowly otherwise.  Follow-up in 6 to 8 weeks

## 2022-07-24 IMAGING — DX DG HIP (WITH OR WITHOUT PELVIS) 2-3V*L*
3 series · 3 of 3 positions shown · non-contrast
Comparison: None Available.

CLINICAL DATA: Hip pain, no known injury.

EXAM:
DG HIP (WITH OR WITHOUT PELVIS) 2-3V LEFT

[pelvis ap]
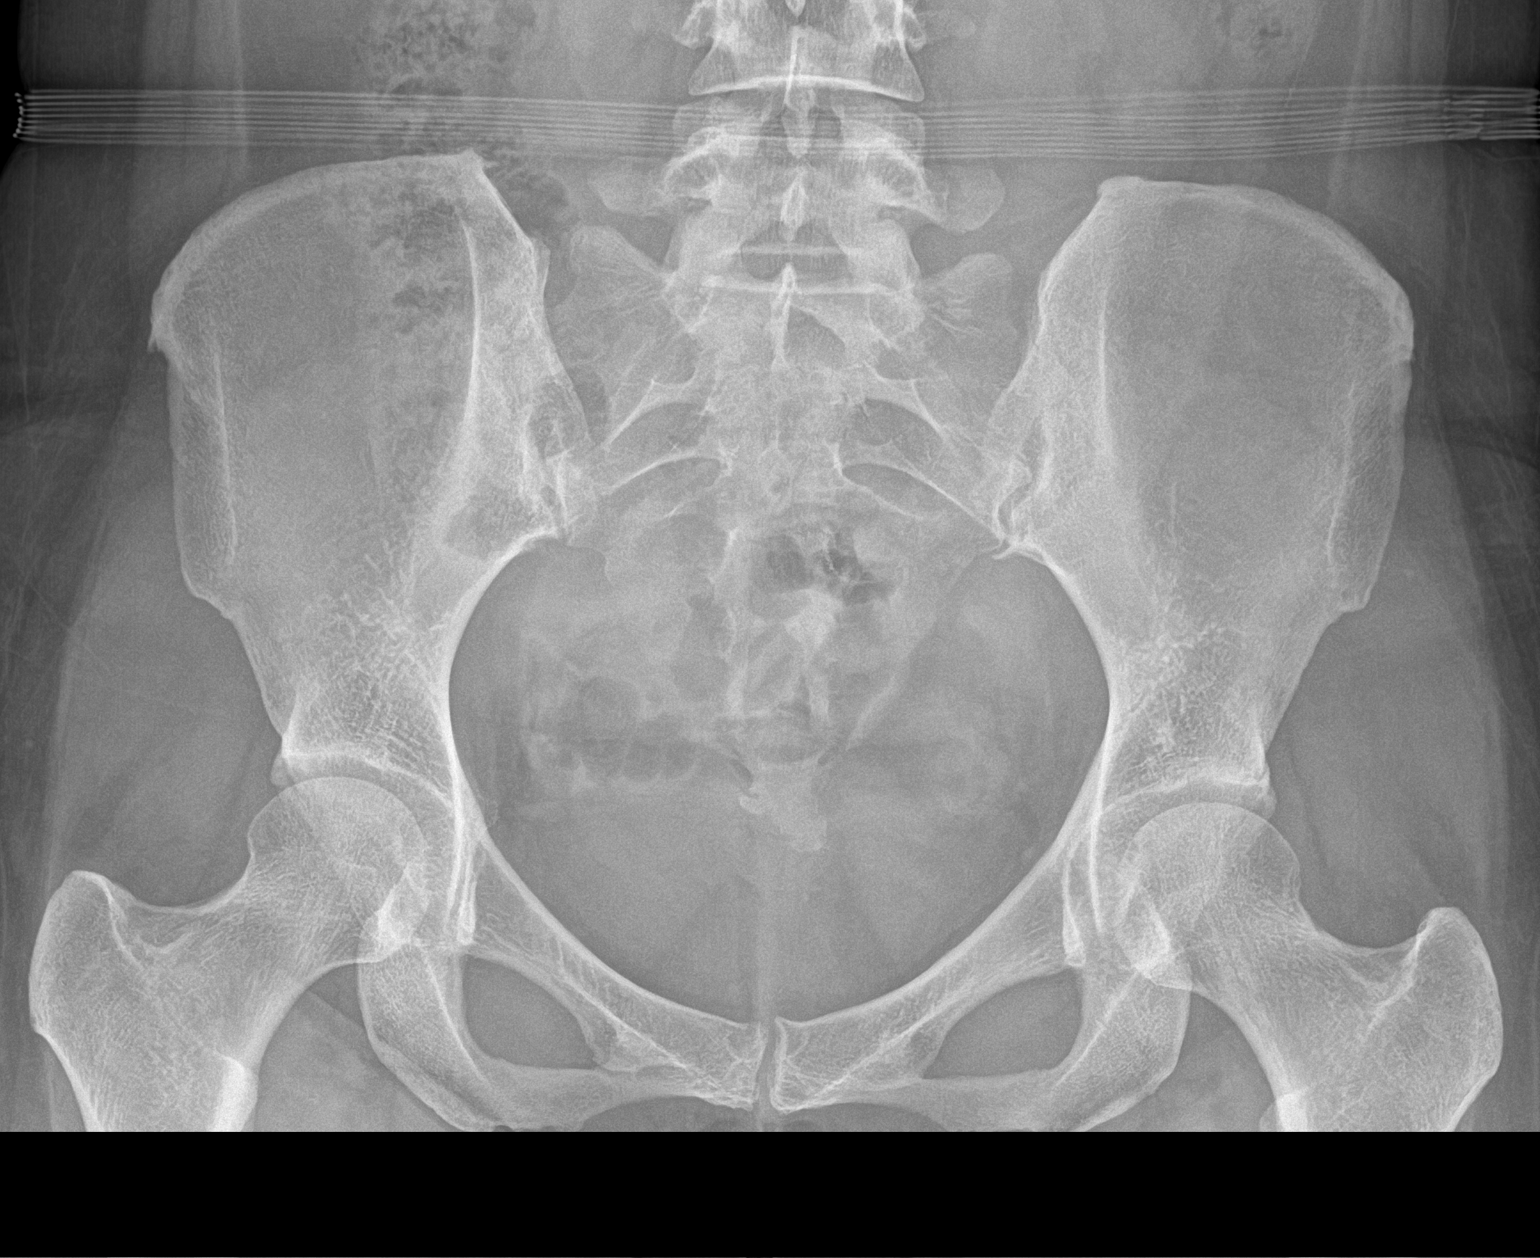

[hip ap]
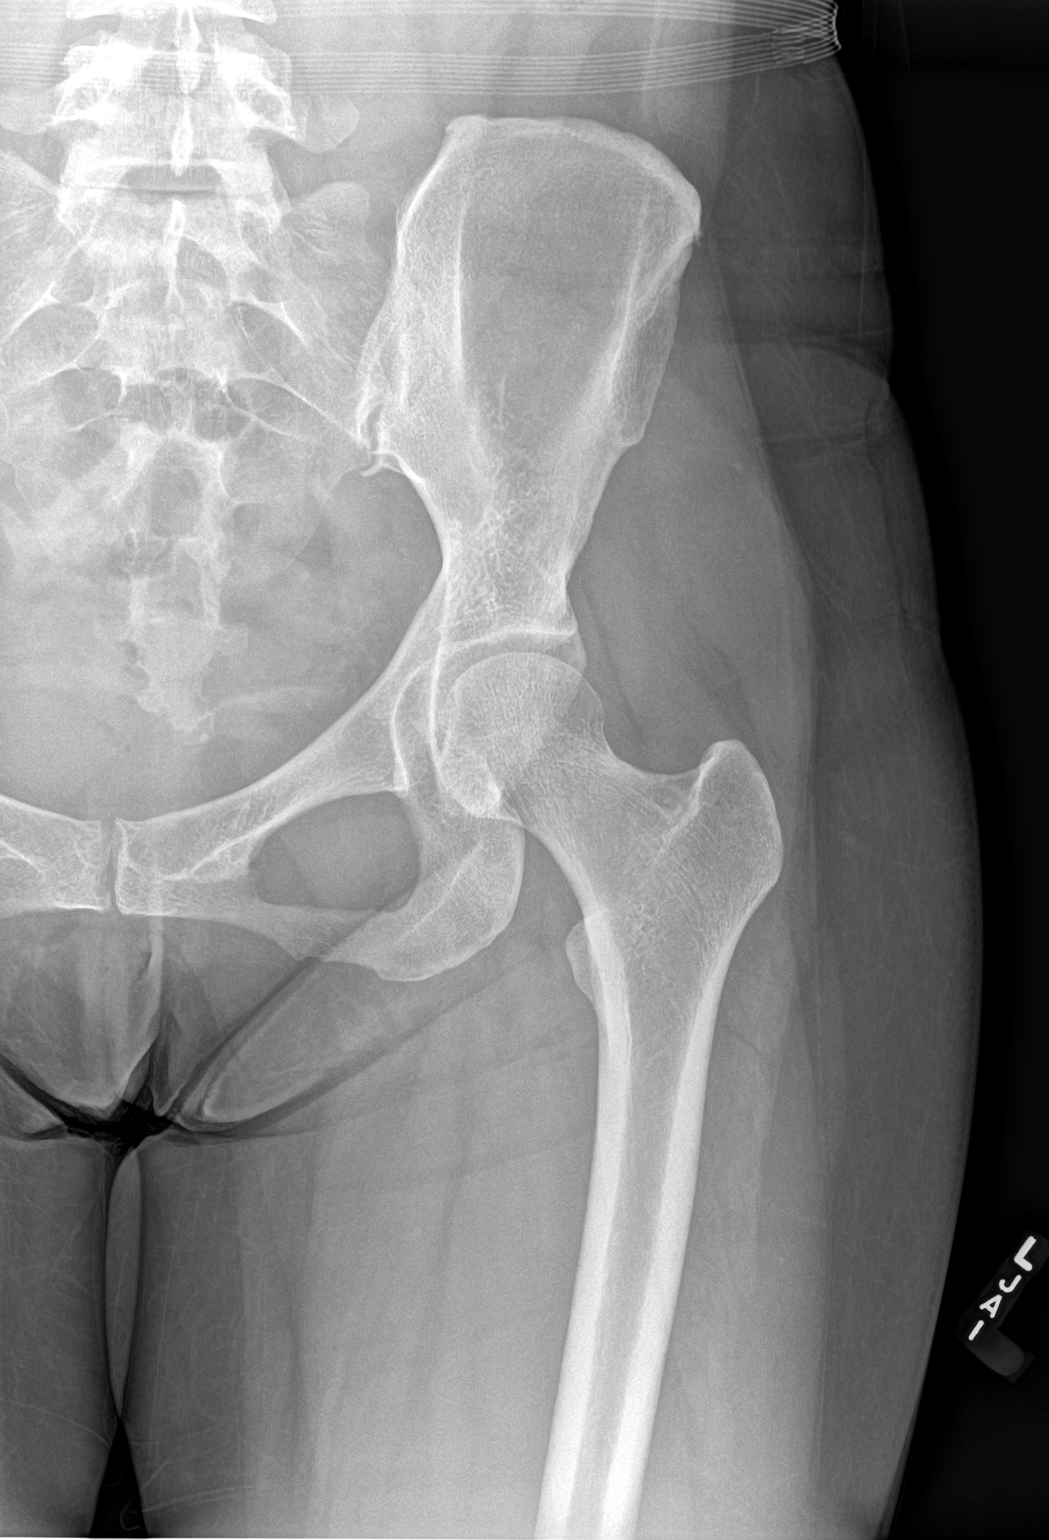

[hip frog leg]
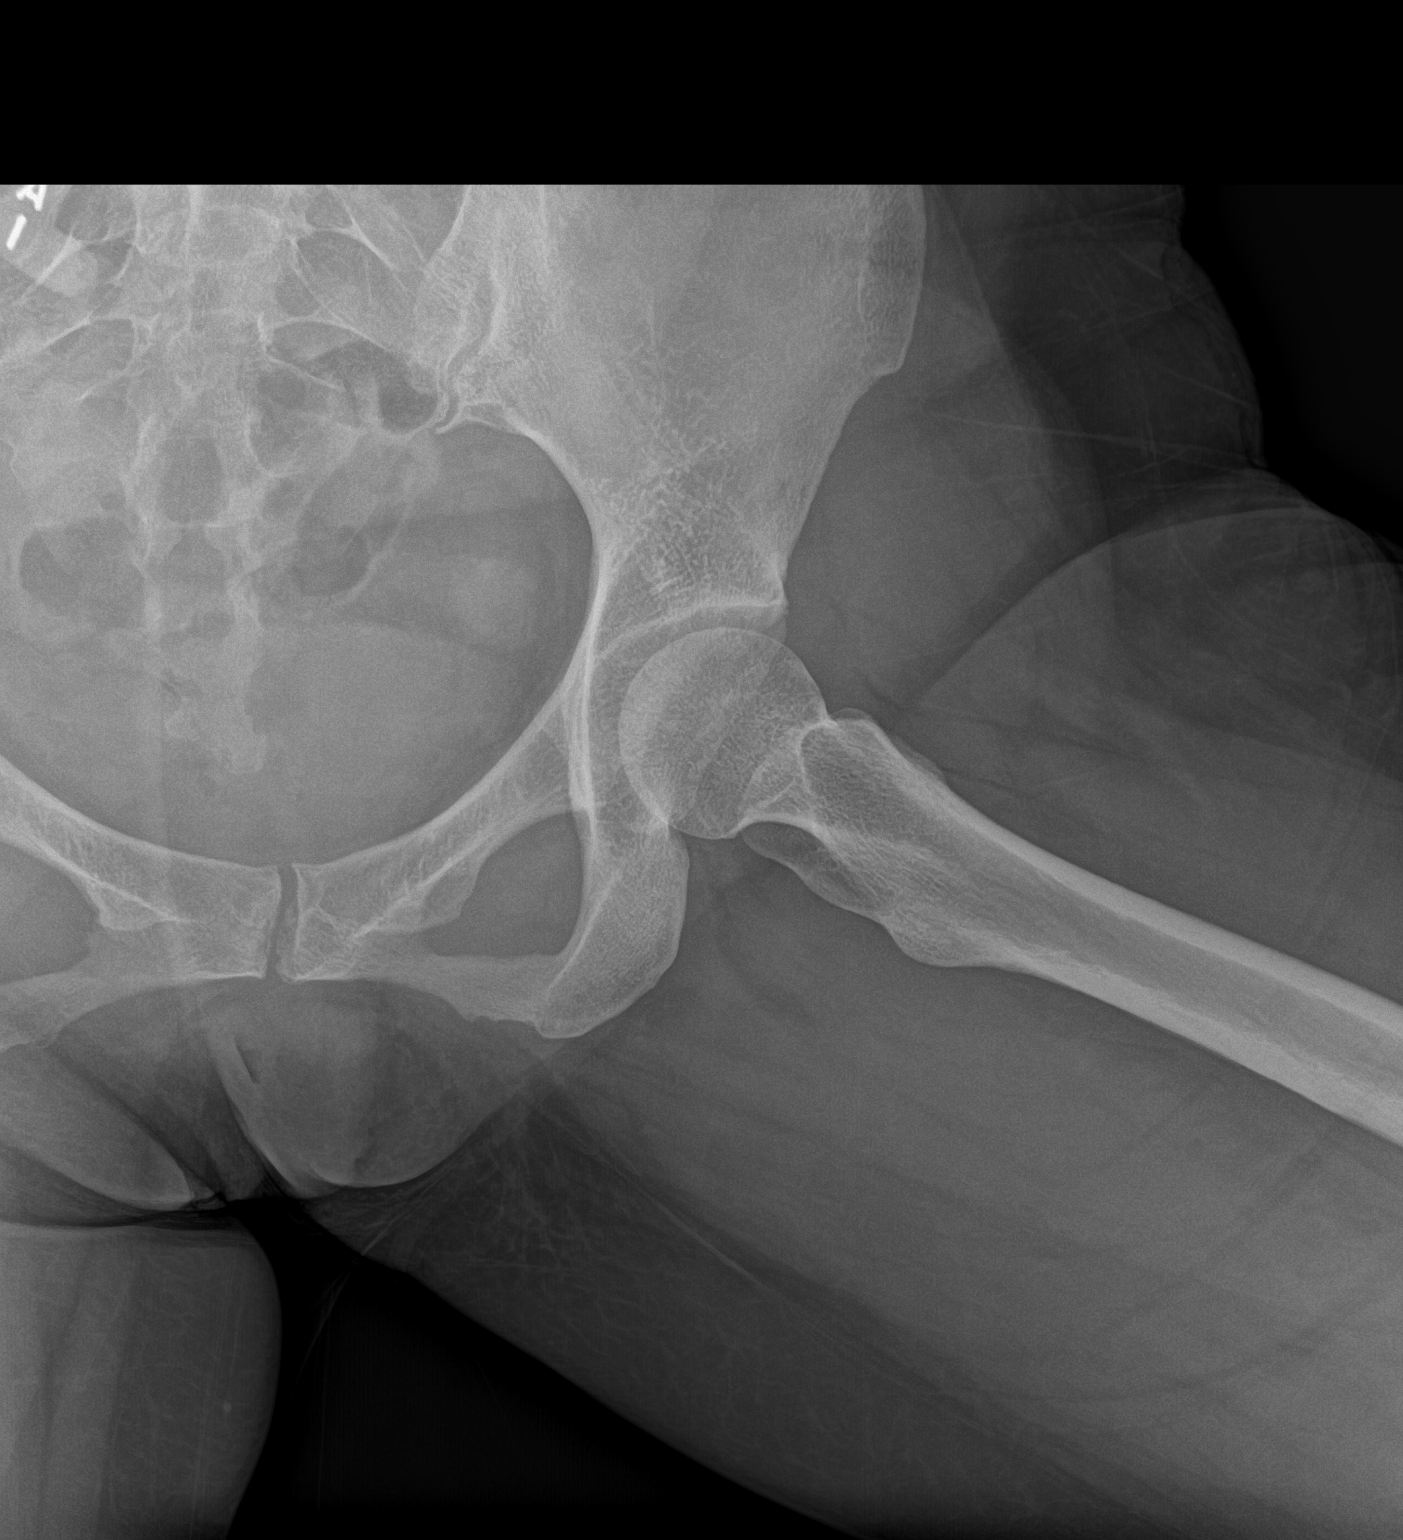

[3 of 3 positions shown; findings below may reference images not displayed]

FINDINGS: There is no evidence of hip fracture or dislocation. There is no
evidence of arthropathy or other focal bone abnormality.
IMPRESSION: Negative.

## 2022-08-08 ENCOUNTER — Ambulatory Visit (INDEPENDENT_AMBULATORY_CARE_PROVIDER_SITE_OTHER): Payer: No Typology Code available for payment source

## 2022-08-08 DIAGNOSIS — J309 Allergic rhinitis, unspecified: Secondary | ICD-10-CM | POA: Diagnosis not present

## 2022-09-11 ENCOUNTER — Ambulatory Visit (INDEPENDENT_AMBULATORY_CARE_PROVIDER_SITE_OTHER): Payer: No Typology Code available for payment source

## 2022-09-11 DIAGNOSIS — J309 Allergic rhinitis, unspecified: Secondary | ICD-10-CM

## 2022-10-20 ENCOUNTER — Ambulatory Visit: Payer: No Typology Code available for payment source | Admitting: Family Medicine

## 2022-11-08 ENCOUNTER — Ambulatory Visit (INDEPENDENT_AMBULATORY_CARE_PROVIDER_SITE_OTHER): Payer: No Typology Code available for payment source

## 2022-11-08 DIAGNOSIS — J309 Allergic rhinitis, unspecified: Secondary | ICD-10-CM | POA: Diagnosis not present

## 2022-11-08 NOTE — Progress Notes (Signed)
Error

## 2022-11-15 DIAGNOSIS — J3081 Allergic rhinitis due to animal (cat) (dog) hair and dander: Secondary | ICD-10-CM

## 2022-11-15 NOTE — Progress Notes (Signed)
VIAL EXP 11-16-23 

## 2022-11-16 ENCOUNTER — Ambulatory Visit (INDEPENDENT_AMBULATORY_CARE_PROVIDER_SITE_OTHER): Payer: No Typology Code available for payment source

## 2022-11-16 DIAGNOSIS — J309 Allergic rhinitis, unspecified: Secondary | ICD-10-CM | POA: Diagnosis not present

## 2022-11-22 ENCOUNTER — Ambulatory Visit: Payer: Self-pay

## 2022-11-24 NOTE — Progress Notes (Signed)
  [facility] Phone: [phone] Subjective:    I'm seeing this patient by the request  of:  Midge Minium, MD  CC: Low back pain  [id number]  Sarah [name] is a 42 y.o. female coming in with complaint of back and neck pain. OMT 07/14/2022. Patient states that she is doing well.   Pain in R medial longitudinal arch since started since working out. Uses elliptical and walking and running outdoors. Has been doing some jumping jacks. Pain worse with weightbearing. Goes barefooted when working out at home but does wear tennis shoes at the gym.   Medications patient has been prescribed: None  Taking:         Reviewed prior external information including notes and imaging from previsou exam, outside providers and external EMR if available.   As well as notes that were available from care everywhere and other healthcare systems.  Past medical history, social, surgical and family history all reviewed in electronic medical record.  No pertanent information unless stated regarding to the chief complaint.   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 POSITIVE muscle aches  Objective  Blood pressure 124/86, pulse 100, height 5\' 1"  (1.549 m), weight 124 lb (56.2 kg), SpO2 98 %.   General: No apparent distress alert and oriented x3 mood and affect normal, dressed appropriately.  HEENT: Pupils equal, extraocular movements intact  Respiratory: Patient's speak in full  sentences and does not appear short of breath  Cardiovascular: No lower extremity edema, non tender, no erythema  Low back exam does have some loss lordosis.  Patient does have some tenderness to palpation of the paraspinal musculature.   Foot exam does have longitudinal   Osteopathic findings  C2 flexed rotated and side bent right C6 flexed rotated and side bent left T3 extended rotated and side bent right inhaled rib T8 extended rotated and side bent left L2 flexed rotated and side bent right Sacrum left on left        Assessment and Plan:  Piriformis syndrome of left side Significant improvement overall.  Discussed icing regimen and home exercises, which reviewed which ones to avoid.  Increase activity slowly.  Follow-up again in 6 to 8 weeks    Nonallopathic problems  Decision today to treat with OMT was based on Physical Exam  After verbal consent patient was treated with HVLA, ME, FPR techniques in cervical, rib, thoracic, lumbar, and sacral  areas  Patient tolerated the procedure well with improvement in symptoms  Patient given exercises, stretches and lifestyle modifications  See medications in patient instructions if given  Patient will follow up in 4-8 weeks     The above documentation has been reviewed and is accurate and complete Lyndal Pulley, DO         Note: This dictation was prepared with Dragon dictation along with smaller phrase technology. Any transcriptional errors that result from this process are unintentional.

## 2022-12-01 ENCOUNTER — Encounter: Payer: Self-pay | Admitting: Family Medicine

## 2022-12-01 ENCOUNTER — Ambulatory Visit (INDEPENDENT_AMBULATORY_CARE_PROVIDER_SITE_OTHER): Payer: 59 | Admitting: Family Medicine

## 2022-12-01 VITALS — BP 124/86 | HR 100 | Ht 61.0 in | Wt 124.0 lb

## 2022-12-01 DIAGNOSIS — M9908 Segmental and somatic dysfunction of rib cage: Secondary | ICD-10-CM | POA: Diagnosis not present

## 2022-12-01 DIAGNOSIS — M9903 Segmental and somatic dysfunction of lumbar region: Secondary | ICD-10-CM | POA: Diagnosis not present

## 2022-12-01 DIAGNOSIS — M9901 Segmental and somatic dysfunction of cervical region: Secondary | ICD-10-CM | POA: Diagnosis not present

## 2022-12-01 DIAGNOSIS — G5702 Lesion of sciatic nerve, left lower limb: Secondary | ICD-10-CM

## 2022-12-01 DIAGNOSIS — M9902 Segmental and somatic dysfunction of thoracic region: Secondary | ICD-10-CM

## 2022-12-01 DIAGNOSIS — M9904 Segmental and somatic dysfunction of sacral region: Secondary | ICD-10-CM | POA: Diagnosis not present

## 2022-12-01 NOTE — Assessment & Plan Note (Signed)
Significant improvement overall.  Discussed icing regimen and home exercises, which reviewed which ones to avoid.  Increase activity slowly.  Follow-up again in 6 to 8 weeks

## 2022-12-01 NOTE — Patient Instructions (Addendum)
[employer] Minimus  HOKA recovery shoes See me again in 6-8 weeks

## 2022-12-13 ENCOUNTER — Ambulatory Visit (INDEPENDENT_AMBULATORY_CARE_PROVIDER_SITE_OTHER): Payer: 59

## 2022-12-13 DIAGNOSIS — J309 Allergic rhinitis, unspecified: Secondary | ICD-10-CM | POA: Diagnosis not present

## 2022-12-21 ENCOUNTER — Other Ambulatory Visit (HOSPITAL_BASED_OUTPATIENT_CLINIC_OR_DEPARTMENT_OTHER): Payer: Self-pay

## 2022-12-21 ENCOUNTER — Telehealth: Payer: Self-pay | Admitting: Allergy & Immunology

## 2022-12-21 MED ORDER — NIRMATRELVIR/RITONAVIR (PAXLOVID)TABLET
3.0000 | ORAL_TABLET | Freq: Two times a day (BID) | ORAL | 0 refills | Status: AC
Start: 2022-12-21 — End: 2022-12-26
  Filled 2022-12-21: qty 30, 5d supply, fill #0

## 2022-12-21 NOTE — Telephone Encounter (Signed)
Patient tested positive for COVID19. Sending in [place].   Salvatore Marvel, MD [facility]

## 2023-01-10 ENCOUNTER — Ambulatory Visit (INDEPENDENT_AMBULATORY_CARE_PROVIDER_SITE_OTHER): Payer: 59 | Admitting: *Deleted

## 2023-01-10 DIAGNOSIS — J309 Allergic rhinitis, unspecified: Secondary | ICD-10-CM

## 2023-01-17 ENCOUNTER — Ambulatory Visit (INDEPENDENT_AMBULATORY_CARE_PROVIDER_SITE_OTHER): Payer: 59

## 2023-01-17 DIAGNOSIS — J309 Allergic rhinitis, unspecified: Secondary | ICD-10-CM

## 2023-01-19 ENCOUNTER — Other Ambulatory Visit: Payer: Self-pay

## 2023-01-19 ENCOUNTER — Encounter: Payer: Self-pay | Admitting: Family Medicine

## 2023-01-19 ENCOUNTER — Other Ambulatory Visit (HOSPITAL_BASED_OUTPATIENT_CLINIC_OR_DEPARTMENT_OTHER): Payer: Self-pay

## 2023-01-19 ENCOUNTER — Ambulatory Visit (INDEPENDENT_AMBULATORY_CARE_PROVIDER_SITE_OTHER): Payer: 59 | Admitting: Family Medicine

## 2023-01-19 VITALS — BP 108/72 | HR 89 | Temp 98.0°F | Resp 16 | Ht 61.0 in | Wt 121.2 lb

## 2023-01-19 DIAGNOSIS — R221 Localized swelling, mass and lump, neck: Secondary | ICD-10-CM

## 2023-01-19 DIAGNOSIS — E559 Vitamin D deficiency, unspecified: Secondary | ICD-10-CM | POA: Insufficient documentation

## 2023-01-19 DIAGNOSIS — Z0000 Encounter for general adult medical examination without abnormal findings: Secondary | ICD-10-CM

## 2023-01-19 MED ORDER — TRIAMCINOLONE ACETONIDE 0.1 % EX OINT
1.0000 | TOPICAL_OINTMENT | Freq: Two times a day (BID) | CUTANEOUS | 1 refills | Status: AC
  Filled 2023-01-19: qty 30, 15d supply, fill #0

## 2023-01-19 MED ORDER — SCOPOLAMINE 1 MG/3DAYS TD PT72
1.0000 | MEDICATED_PATCH | TRANSDERMAL | 1 refills | Status: DC
  Filled 2023-01-19: qty 10, 30d supply, fill #0

## 2023-01-19 MED ORDER — SCOPOLAMINE 1 MG/3DAYS TD PT72: 1.0000 | MEDICATED_PATCH | TRANSDERMAL | 1 refills | Status: DC

## 2023-01-19 NOTE — Assessment & Plan Note (Signed)
Check labs and replete prn. 

## 2023-01-19 NOTE — Assessment & Plan Note (Signed)
Pt's PE WNL w/ exception of L sided neck/thyroid nodule.  UTD on pap, mammo, Tdap, flu.  Check labs via Doctor[facility] Day.  Anticipatory guidance provided.

## 2023-01-19 NOTE — Patient Instructions (Signed)
Follow up in 1 year or as needed We'll call you to schedule your ultrasound Keep up the good work!  You look great! Call with any questions or concerns Stay Safe!  Stay Healthy!! Have an AMAZING trip!!!

## 2023-01-19 NOTE — Progress Notes (Signed)
   Subjective:    Patient ID: Sarah [name], female    DOB: [birth year], 42 y.o.   MRN: [id number]  HPI CPE- UTD on pap, mammo, Tdap.  Will get labs through work.  No concerns today  Patient Care Team    Relationship Specialty Notifications Start End  Midge Minium, MD PCP - General Family Medicine  11/26/18     Health Maintenance  Topic Date Due   PAP SMEAR-Modifier  12/22/2024   DTaP/Tdap/Td (4 - Td or Tdap) 08/11/2027   INFLUENZA VACCINE  Completed   HPV VACCINES  Aged Out   COVID-19 Vaccine  Discontinued   Hepatitis C Screening  Discontinued   HIV Screening  Discontinued      Review of Systems Patient reports no vision/ hearing changes, adenopathy,fever, weight change,  persistant/recurrent hoarseness , swallowing issues, chest pain, palpitations, edema, persistant/recurrent cough, hemoptysis, dyspnea (rest/exertional/paroxysmal nocturnal), gastrointestinal bleeding (melena, rectal bleeding), abdominal pain, significant heartburn, bowel changes, GU symptoms (dysuria, hematuria, incontinence), Gyn symptoms (abnormal  bleeding, pain),  syncope, focal weakness, memory loss, numbness & tingling, skin/hair/nail changes, abnormal bruising or bleeding, anxiety, or depression.     Objective:   Physical Exam General Appearance:    Alert, cooperative, no distress, appears stated age  Head:    Normocephalic, without obvious abnormality, atraumatic  Eyes:    PERRL, conjunctiva/corneas clear, EOM's intact both eyes  Ears:    Normal TM's and external ear canals, both ears  Nose:   Nares normal, septum midline, mucosa normal, no drainage    or sinus tenderness  Throat:   Lips, mucosa, and tongue normal; teeth and gums normal  Neck:   Supple, symmetrical, trachea midline, no adenopathy;    Thyroid: no enlargement/tenderness, L sided nodule  Back:     Symmetric, no curvature, ROM normal, no CVA tenderness  Lungs:     Clear to auscultation bilaterally, respirations unlabored  Chest Wall:    No  tenderness or deformity   Heart:    Regular rate and rhythm, S1 and S2 normal, no murmur, rub   or gallop  Breast Exam:    Deferred to GYN  Abdomen:     Soft, non-tender, bowel sounds active all four quadrants,    no masses, no organomegaly  Genitalia:    Deferred to GYN  Rectal:    Extremities:   Extremities normal, atraumatic, no cyanosis or edema  Pulses:   2+ and symmetric all extremities  Skin:   Skin color, texture, turgor normal, no rashes or lesions  Lymph nodes:   Cervical, supraclavicular, and axillary nodes normal  Neurologic:   CNII-XII intact, normal strength, sensation and reflexes    throughout          Assessment & Plan:

## 2023-01-24 ENCOUNTER — Ambulatory Visit (INDEPENDENT_AMBULATORY_CARE_PROVIDER_SITE_OTHER): Payer: 59

## 2023-01-24 DIAGNOSIS — J309 Allergic rhinitis, unspecified: Secondary | ICD-10-CM | POA: Diagnosis not present

## 2023-01-26 ENCOUNTER — Ambulatory Visit: Payer: 59 | Admitting: Family Medicine

## 2023-02-02 ENCOUNTER — Encounter: Payer: Self-pay | Admitting: Family Medicine

## 2023-02-02 ENCOUNTER — Telehealth: Payer: Self-pay

## 2023-02-02 ENCOUNTER — Ambulatory Visit (HOSPITAL_BASED_OUTPATIENT_CLINIC_OR_DEPARTMENT_OTHER)
Admission: RE | Admit: 2023-02-02 | Discharge: 2023-02-02 | Disposition: A | Discharge status: Home / Self Care | Payer: 59 | Source: Ambulatory Visit | Attending: Family Medicine | Admitting: Family Medicine

## 2023-02-02 DIAGNOSIS — R221 Localized swelling, mass and lump, neck: Secondary | ICD-10-CM | POA: Diagnosis not present

## 2023-02-02 DIAGNOSIS — Z1231 Encounter for screening mammogram for malignant neoplasm of breast: Secondary | ICD-10-CM

## 2023-02-02 DIAGNOSIS — E049 Nontoxic goiter, unspecified: Secondary | ICD-10-CM | POA: Diagnosis not present

## 2023-02-02 NOTE — Telephone Encounter (Signed)
-----   Message from Midge Minium, MD sent at 02/02/2023  9:00 AM EDT ----- Normal thyroid- great news!!!

## 2023-02-08 ENCOUNTER — Telehealth: Payer: Self-pay

## 2023-02-08 ENCOUNTER — Other Ambulatory Visit (HOSPITAL_BASED_OUTPATIENT_CLINIC_OR_DEPARTMENT_OTHER): Payer: Self-pay

## 2023-02-08 MED ORDER — EPINEPHRINE 0.3 MG/0.3ML IJ SOAJ: 0.3000 mg | INTRAMUSCULAR | 2 refills | Status: AC | PRN

## 2023-02-08 MED ORDER — OLOPATADINE HCL 0.2 % OP SOLN
1.0000 [drp] | Freq: Every day | OPHTHALMIC | 3 refills | Status: DC | PRN
  Filled 2023-02-08 – 2023-02-10 (×2): qty 2.5, 50d supply, fill #0

## 2023-02-08 MED ORDER — AZELASTINE-FLUTICASONE 137-50 MCG/ACT NA SUSP
1.0000 | Freq: Two times a day (BID) | NASAL | 3 refills | Status: DC
  Filled 2023-02-08 – 2023-02-22 (×3): qty 23, 30d supply, fill #0
  Filled 2023-03-23: qty 23, 30d supply, fill #1
  Filled 2023-06-28: qty 23, 30d supply, fill #2
  Filled 2023-07-20 – 2023-07-24 (×2): qty 23, 30d supply, fill #3
  Filled ????-??-??: fill #2

## 2023-02-08 NOTE — Telephone Encounter (Signed)
Patient needed med refills.

## 2023-02-09 ENCOUNTER — Other Ambulatory Visit (HOSPITAL_BASED_OUTPATIENT_CLINIC_OR_DEPARTMENT_OTHER): Payer: Self-pay

## 2023-02-10 ENCOUNTER — Other Ambulatory Visit (HOSPITAL_BASED_OUTPATIENT_CLINIC_OR_DEPARTMENT_OTHER): Payer: Self-pay

## 2023-02-12 ENCOUNTER — Other Ambulatory Visit: Payer: Self-pay | Admitting: Family Medicine

## 2023-02-12 ENCOUNTER — Other Ambulatory Visit (HOSPITAL_COMMUNITY): Payer: Self-pay

## 2023-02-12 ENCOUNTER — Telehealth: Payer: Self-pay

## 2023-02-12 ENCOUNTER — Encounter (HOSPITAL_BASED_OUTPATIENT_CLINIC_OR_DEPARTMENT_OTHER): Payer: Self-pay

## 2023-02-12 ENCOUNTER — Other Ambulatory Visit (HOSPITAL_BASED_OUTPATIENT_CLINIC_OR_DEPARTMENT_OTHER): Payer: Self-pay

## 2023-02-12 DIAGNOSIS — E559 Vitamin D deficiency, unspecified: Secondary | ICD-10-CM | POA: Diagnosis not present

## 2023-02-12 MED ORDER — FLUTICASONE PROPIONATE 50 MCG/ACT NA SUSP
2.0000 | Freq: Every day | NASAL | 11 refills | Status: DC
  Filled 2023-02-12 – 2023-02-19 (×2): qty 16, 30d supply, fill #0

## 2023-02-12 MED ORDER — AZELASTINE HCL 0.1 % NA SOLN
1.0000 | Freq: Two times a day (BID) | NASAL | 11 refills | Status: DC | PRN
  Filled 2023-02-12 – 2023-02-19 (×2): qty 30, 30d supply, fill #0

## 2023-02-12 NOTE — Telephone Encounter (Signed)
PA request received via CMM for Azelastine-Fluticasone 137-50MCG/ACT suspension  PA has been submitted to [employer] and is pending determination.  Key: [id number]

## 2023-02-13 LAB — VITAMIN D 25 HYDROXY (VIT D DEFICIENCY, FRACTURES): Vit D, 25-Hydroxy: 41.5 ng/mL (ref 30.0–100.0)

## 2023-02-13 NOTE — Telephone Encounter (Signed)
Pa has been approved thru [employer]

## 2023-02-15 NOTE — Progress Notes (Signed)
Patient aware of + result

## 2023-02-16 ENCOUNTER — Other Ambulatory Visit (HOSPITAL_BASED_OUTPATIENT_CLINIC_OR_DEPARTMENT_OTHER): Payer: Self-pay

## 2023-02-19 ENCOUNTER — Other Ambulatory Visit (HOSPITAL_BASED_OUTPATIENT_CLINIC_OR_DEPARTMENT_OTHER): Payer: Self-pay

## 2023-02-22 ENCOUNTER — Other Ambulatory Visit (HOSPITAL_BASED_OUTPATIENT_CLINIC_OR_DEPARTMENT_OTHER): Payer: Self-pay

## 2023-02-22 MED ORDER — AMOXICILLIN 500 MG PO CAPS
500.0000 mg | ORAL_CAPSULE | Freq: Two times a day (BID) | ORAL | 0 refills | Status: DC
  Filled 2023-02-22: qty 21, 11d supply, fill #0

## 2023-02-26 ENCOUNTER — Ambulatory Visit (INDEPENDENT_AMBULATORY_CARE_PROVIDER_SITE_OTHER): Payer: 59 | Admitting: *Deleted

## 2023-02-26 DIAGNOSIS — J309 Allergic rhinitis, unspecified: Secondary | ICD-10-CM | POA: Diagnosis not present

## 2023-02-27 ENCOUNTER — Other Ambulatory Visit (HOSPITAL_BASED_OUTPATIENT_CLINIC_OR_DEPARTMENT_OTHER): Payer: Self-pay

## 2023-02-27 ENCOUNTER — Other Ambulatory Visit: Payer: Self-pay

## 2023-02-27 ENCOUNTER — Encounter (HOSPITAL_BASED_OUTPATIENT_CLINIC_OR_DEPARTMENT_OTHER): Payer: Self-pay

## 2023-02-27 MED ORDER — BEPOTASTINE BESILATE 1.5 % OP SOLN
1.0000 [drp] | Freq: Two times a day (BID) | OPHTHALMIC | 2 refills | Status: DC | PRN
  Filled 2023-02-27 – 2023-03-30 (×3): qty 5, 50d supply, fill #0

## 2023-02-28 ENCOUNTER — Telehealth: Payer: Self-pay

## 2023-02-28 NOTE — Telephone Encounter (Signed)
PA request received via CMM for Bepotastine Besilate 1.5% solution  PA has been submitted to [employer] and is pending additional questions/determination  Key: [id number]

## 2023-03-02 ENCOUNTER — Encounter: Payer: Self-pay | Admitting: Family Medicine

## 2023-03-02 NOTE — Telephone Encounter (Signed)
Pa was denied pt has to try and fail epinastine 0.05% eye drops or azelastine 0.05% eye drops please advis eto change in regiment

## 2023-03-05 ENCOUNTER — Encounter: Payer: Self-pay | Admitting: *Deleted

## 2023-03-05 ENCOUNTER — Other Ambulatory Visit (HOSPITAL_BASED_OUTPATIENT_CLINIC_OR_DEPARTMENT_OTHER): Payer: Self-pay

## 2023-03-05 MED ORDER — AZELASTINE HCL 0.05 % OP SOLN
OPHTHALMIC | 12 refills | Status: DC
  Filled 2023-03-05: qty 6, 30d supply, fill #0
  Filled 2023-03-30: qty 6, 24d supply, fill #0

## 2023-03-05 NOTE — Addendum Note (Signed)
Addended by: Rolland Bimler D on: 03/05/2023 12:04 PM   Modules accepted: Orders

## 2023-03-05 NOTE — Telephone Encounter (Signed)
PA has been DENIED due to:   Your provider requested bepotastine 1.5% eye drops to manage your condition of allergic rhinitis (seasonal allergies that can cause itchy, watery eyes and sneezing).When used for the treatment of allergic rhinitis, our guideline named standard step therapy, reviewed for bepotastine1.5% eye drops, requires that you meet one (1) of the following rules for approval: 1) You have previously tried a generic ophthalmic (eye) antihistamine eye drop (a class of medication) such as epinastine 0.05% eye drops and azelastine 0.05% eye drops. 2) You have a valid medical reason (a contraindication) why you cannot try a generic ophthalmic antihistamine eye drop such as epinastine 0.05% eye drops and azelastine 0.05% eye drops. This request was denied because we did not receive information that you meet the requirement listed above. Please work with your doctor to use a different medication or get [place] more information if it will allow [place] to approve this request.

## 2023-03-05 NOTE — Telephone Encounter (Signed)
Optivar has been sent in to replace bepotastine 1.5% eye drops per Dr. Delorse Lek.

## 2023-03-12 ENCOUNTER — Other Ambulatory Visit (HOSPITAL_BASED_OUTPATIENT_CLINIC_OR_DEPARTMENT_OTHER): Payer: Self-pay

## 2023-03-23 ENCOUNTER — Ambulatory Visit: Payer: 59 | Admitting: Family Medicine

## 2023-03-23 ENCOUNTER — Encounter: Payer: Self-pay | Admitting: Family Medicine

## 2023-03-23 VITALS — BP 112/80 | HR 71 | Temp 98.4°F | Resp 16 | Ht 61.0 in | Wt 124.1 lb

## 2023-03-23 DIAGNOSIS — R030 Elevated blood-pressure reading, without diagnosis of hypertension: Secondary | ICD-10-CM | POA: Diagnosis not present

## 2023-03-23 NOTE — Progress Notes (Signed)
   Subjective:    Patient ID: Sarah [name], female    DOB: 11-Feb-1981, 42 y.o.   MRN: [id number]  HPI Elevated BP- pt reports she has been to dentist multiple times and BP was elevated to 140/90.  Was also elevated at the gym and on her home cuff.  Home cuffs are not consistent w/ office reading- 138/110.  No CP, SOB.  Occasional HA's.  No visual changes.  At times she can feel her pulse at night while lying down.  No changes in stress level.  Mom and dad both w/ high BP.    R arm 118/80 L arm 122/82   Review of Systems For ROS see HPI     Objective:   Physical Exam Vitals reviewed.  Constitutional:      General: She is not in acute distress.    Appearance: Normal appearance. She is well-developed. She is not ill-appearing.  HENT:     Head: Normocephalic and atraumatic.  Eyes:     Conjunctiva/sclera: Conjunctivae normal.     Pupils: Pupils are equal, round, and reactive to light.  Neck:     Thyroid: No thyromegaly.  Cardiovascular:     Rate and Rhythm: Normal rate and regular rhythm.     Pulses: Normal pulses.     Heart sounds: Normal heart sounds. No murmur heard. Pulmonary:     Effort: Pulmonary effort is normal. No respiratory distress.     Breath sounds: Normal breath sounds.  Abdominal:     General: There is no distension.     Palpations: Abdomen is soft.     Tenderness: There is no abdominal tenderness.  Musculoskeletal:     Cervical back: Normal range of motion and neck supple.  Lymphadenopathy:     Cervical: No cervical adenopathy.  Skin:    General: Skin is warm and dry.  Neurological:     General: No focal deficit present.     Mental Status: She is alert and oriented to person, place, and time.  Psychiatric:        [name] and Affect: [name] normal.        Behavior: Behavior normal.           Assessment & Plan:  Elevated BP- new.  Pt's elevated readings have all been w/ automated rather than manual cuffs.  We checked BP manually in both arms and it was well  in normal range.  Discussed that automated cuffs often don't fit correctly or squeeze too tightly which falsely elevates BP.  Encouraged her to monitor her BP at work w/ manual readings and to let me know what they are.  I am hesitant to start any medication at this time as her readings in office tend to run on the lower side and she will occasionally have dizziness upon standing.  Will follow closely.

## 2023-03-23 NOTE — Patient Instructions (Addendum)
Please send me your blood pressure readings next Thursday so we can review Have them check it at work- manually if possible Your manual readings have all been great so I wonder if it's the automated ones that are just squeezing too tightly Try and limit your salt intake and increase your water Call with any questions or concerns We'll figure this out!  Try not to stress!!

## 2023-03-29 ENCOUNTER — Encounter: Payer: Self-pay | Admitting: Family Medicine

## 2023-03-30 ENCOUNTER — Other Ambulatory Visit (HOSPITAL_BASED_OUTPATIENT_CLINIC_OR_DEPARTMENT_OTHER): Payer: Self-pay

## 2023-03-30 ENCOUNTER — Ambulatory Visit (HOSPITAL_BASED_OUTPATIENT_CLINIC_OR_DEPARTMENT_OTHER)
Admission: RE | Admit: 2023-03-30 | Discharge: 2023-03-30 | Disposition: A | Discharge status: Home / Self Care | Payer: 59 | Source: Ambulatory Visit | Attending: Family Medicine | Admitting: Family Medicine

## 2023-03-30 DIAGNOSIS — Z1231 Encounter for screening mammogram for malignant neoplasm of breast: Secondary | ICD-10-CM | POA: Insufficient documentation

## 2023-04-02 ENCOUNTER — Ambulatory Visit (INDEPENDENT_AMBULATORY_CARE_PROVIDER_SITE_OTHER): Payer: 59 | Admitting: *Deleted

## 2023-04-02 DIAGNOSIS — J309 Allergic rhinitis, unspecified: Secondary | ICD-10-CM

## 2023-05-07 ENCOUNTER — Ambulatory Visit (INDEPENDENT_AMBULATORY_CARE_PROVIDER_SITE_OTHER): Payer: 59

## 2023-05-07 DIAGNOSIS — J309 Allergic rhinitis, unspecified: Secondary | ICD-10-CM

## 2023-05-17 NOTE — Progress Notes (Signed)
  [name] [address] [place] [address] Phone: [phone] Subjective:   Bruce Donath, am serving as a scribe for Dr. Antoine Primas.  I'm seeing this patient by the request  of:  Sheliah Hatch, MD  CC: Neck and back pain follow-up  [id number]  Sarah [name] is a 42 y.o. female coming in with complaint of back and neck pain. OMT 12/01/2022. Patient states overall he continues to have some discomfort.  Nothing that stopping him from activity well.  Happy with results so far.  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   General: No apparent distress alert and oriented x3 mood and affect normal, dressed appropriately.  HEENT: Pupils equal, extraocular movements intact  Respiratory: Patient's speak in full sentences and does not appear short of breath  Cardiovascular: No lower extremity edema, non tender, no erythema  Low back exam does have some loss lordosis noted.  Seems to be more tenderness over the left side of the sacroiliac joint.  Osteopathic findings  C2 flexed rotated and side bent left C6 flexed rotated and side bent left T3 extended rotated and side bent left inhaled rib Sacrum left on left       Assessment and Plan:  Piriformis syndrome of left side Continues to have difficulty on the left side bilaterally.  Noted to have some tightness also noted in the parascapular area which is a little bit worse than usual.  Still nothing that is completely significant or concerning.  Discussed which activities to do and which ones to avoid.  Increase activity slowly.   Discussed posture and ergonomics.  Follow-up again in 6 to 8 weeks    Nonallopathic problems  Decision today to treat with OMT was based on Physical Exam  After verbal consent patient was treated with HVLA, ME, FPR techniques in cervical, rib, thoracic,  and sacral  areas  Patient tolerated the procedure well with improvement in symptoms  Patient given exercises, stretches and lifestyle modifications  See medications in patient instructions if given  Patient will follow up in 4-8 weeks    The above documentation has been reviewed and is accurate and complete Judi Saa, DO          Note: This dictation was prepared with Dragon dictation along with smaller phrase technology. Any transcriptional errors that result from this process are unintentional.

## 2023-05-18 ENCOUNTER — Ambulatory Visit (INDEPENDENT_AMBULATORY_CARE_PROVIDER_SITE_OTHER): Payer: 59 | Admitting: Family Medicine

## 2023-05-18 ENCOUNTER — Encounter: Payer: Self-pay | Admitting: Family Medicine

## 2023-05-18 VITALS — BP 102/78 | HR 74 | Ht 61.0 in | Wt 126.0 lb

## 2023-05-18 DIAGNOSIS — M9902 Segmental and somatic dysfunction of thoracic region: Secondary | ICD-10-CM | POA: Diagnosis not present

## 2023-05-18 DIAGNOSIS — G5702 Lesion of sciatic nerve, left lower limb: Secondary | ICD-10-CM

## 2023-05-18 DIAGNOSIS — M9908 Segmental and somatic dysfunction of rib cage: Secondary | ICD-10-CM

## 2023-05-18 DIAGNOSIS — M9901 Segmental and somatic dysfunction of cervical region: Secondary | ICD-10-CM

## 2023-05-18 DIAGNOSIS — M9904 Segmental and somatic dysfunction of sacral region: Secondary | ICD-10-CM | POA: Diagnosis not present

## 2023-05-18 NOTE — Assessment & Plan Note (Signed)
Continues to have difficulty on the left side bilaterally.  Noted to have some tightness also noted in the parascapular area which is a little bit worse than usual.  Still nothing that is completely significant or concerning.  Discussed which activities to do and which ones to avoid.  Increase activity slowly.  Discussed posture and ergonomics.  Follow-up again in 6 to 8 weeks

## 2023-05-18 NOTE — Patient Instructions (Signed)
Great to see you Change can be good but scary See me in 3 months

## 2023-06-04 ENCOUNTER — Ambulatory Visit (INDEPENDENT_AMBULATORY_CARE_PROVIDER_SITE_OTHER): Payer: 59 | Admitting: *Deleted

## 2023-06-04 DIAGNOSIS — J309 Allergic rhinitis, unspecified: Secondary | ICD-10-CM

## 2023-06-28 ENCOUNTER — Encounter (HOSPITAL_BASED_OUTPATIENT_CLINIC_OR_DEPARTMENT_OTHER): Payer: Self-pay

## 2023-06-28 ENCOUNTER — Other Ambulatory Visit (HOSPITAL_BASED_OUTPATIENT_CLINIC_OR_DEPARTMENT_OTHER): Payer: Self-pay

## 2023-07-06 ENCOUNTER — Ambulatory Visit: Payer: 59 | Admitting: Family Medicine

## 2023-07-09 ENCOUNTER — Ambulatory Visit (INDEPENDENT_AMBULATORY_CARE_PROVIDER_SITE_OTHER): Payer: 59

## 2023-07-09 DIAGNOSIS — J309 Allergic rhinitis, unspecified: Secondary | ICD-10-CM | POA: Diagnosis not present

## 2023-07-13 ENCOUNTER — Encounter: Payer: Self-pay | Admitting: Family Medicine

## 2023-07-13 ENCOUNTER — Ambulatory Visit: Payer: 59 | Admitting: Family Medicine

## 2023-07-13 ENCOUNTER — Other Ambulatory Visit (HOSPITAL_BASED_OUTPATIENT_CLINIC_OR_DEPARTMENT_OTHER): Payer: Self-pay

## 2023-07-13 VITALS — BP 138/98 | HR 76 | Temp 98.4°F | Resp 17 | Ht 61.0 in | Wt 124.4 lb

## 2023-07-13 DIAGNOSIS — I10 Essential (primary) hypertension: Secondary | ICD-10-CM

## 2023-07-13 MED ORDER — AMLODIPINE BESYLATE 5 MG PO TABS
5.0000 mg | ORAL_TABLET | Freq: Every day | ORAL | 1 refills | Status: DC
  Filled 2023-07-13: qty 90, 90d supply, fill #0

## 2023-07-13 NOTE — Patient Instructions (Signed)
Follow up in 6 weeks to recheck blood pressure (or you can just send me your readings if they look good) START the Amlodipine once daily Keep up the good work on increasing your water intake, limiting your salt intake, and limiting your caffeine Call with any questions or concerns Hang in there!!

## 2023-07-13 NOTE — Progress Notes (Unsigned)
   Subjective:    Patient ID: Sarah [name], female    DOB: February 16, 1981, 42 y.o.   MRN: [id number]  HPI Elevated BP- pt reports BP's are normal in the morning and elevated in the afternoon.  Has decreased caffeine, limited her sodium intake.  Is exercising regularly.  + family hx of HTN.  Pt reports she can feel her blood pressure climbing as the day goes on.     Review of Systems For ROS see HPI     Objective:   Physical Exam Vitals reviewed.  Constitutional:      General: She is not in acute distress.    Appearance: Normal appearance. She is well-developed. She is not ill-appearing.  HENT:     Head: Normocephalic and atraumatic.  Eyes:     Conjunctiva/sclera: Conjunctivae normal.     Pupils: Pupils are equal, round, and reactive to light.  Neck:     Thyroid: No thyromegaly.  Cardiovascular:     Rate and Rhythm: Normal rate and regular rhythm.     Pulses: Normal pulses.     Heart sounds: Normal heart sounds. No murmur heard. Pulmonary:     Effort: Pulmonary effort is normal. No respiratory distress.     Breath sounds: Normal breath sounds.  Abdominal:     General: There is no distension.     Palpations: Abdomen is soft.     Tenderness: There is no abdominal tenderness.  Musculoskeletal:     Cervical back: Normal range of motion and neck supple.     Right lower leg: No edema.     Left lower leg: No edema.  Lymphadenopathy:     Cervical: No cervical adenopathy.  Skin:    General: Skin is warm and dry.  Neurological:     General: No focal deficit present.     Mental Status: She is alert and oriented to person, place, and time.  Psychiatric:        [name] and Affect: [name] normal.        Behavior: Behavior normal.        Thought Content: Thought content normal.           Assessment & Plan:

## 2023-07-17 DIAGNOSIS — I10 Essential (primary) hypertension: Secondary | ICD-10-CM | POA: Insufficient documentation

## 2023-07-17 NOTE — Assessment & Plan Note (Signed)
New.  Home BP's indicate that AM readings are normal but BP rises throughout the day.  She reports she can feel when BP goes up.  + family hx.  She has decreased sodium and caffeine intake.  Exercising regularly.  EKG today is normal.  Start Amlodipine as this doesn't require any lab monitoring and doesn't cause increased urination.  Pt expressed understanding and is in agreement w/ plan.

## 2023-07-20 ENCOUNTER — Other Ambulatory Visit (HOSPITAL_BASED_OUTPATIENT_CLINIC_OR_DEPARTMENT_OTHER): Payer: Self-pay

## 2023-07-27 ENCOUNTER — Ambulatory Visit: Payer: 59 | Admitting: Family Medicine

## 2023-07-27 ENCOUNTER — Encounter: Payer: Self-pay | Admitting: Family Medicine

## 2023-07-27 ENCOUNTER — Telehealth: Payer: Self-pay

## 2023-07-27 VITALS — BP 118/84 | HR 77 | Temp 97.9°F | Resp 18 | Wt 126.4 lb

## 2023-07-27 DIAGNOSIS — R42 Dizziness and giddiness: Secondary | ICD-10-CM | POA: Diagnosis not present

## 2023-07-27 LAB — CBC WITH DIFFERENTIAL/PLATELET
Basophils Absolute: 0.1 10*3/uL (ref 0.0–0.1)
Basophils Relative: 0.7 % (ref 0.0–3.0)
Eosinophils Absolute: 0.1 10*3/uL (ref 0.0–0.7)
Eosinophils Relative: 1.6 % (ref 0.0–5.0)
HCT: 44.6 % (ref 36.0–46.0)
Hemoglobin: 14.5 g/dL (ref 12.0–15.0)
Lymphocytes Relative: 29.8 % (ref 12.0–46.0)
Lymphs Abs: 2.3 10*3/uL (ref 0.7–4.0)
MCHC: 32.5 g/dL (ref 30.0–36.0)
MCV: 93.4 fl (ref 78.0–100.0)
Monocytes Absolute: 0.4 10*3/uL (ref 0.1–1.0)
Monocytes Relative: 5.3 % (ref 3.0–12.0)
Neutro Abs: 4.9 10*3/uL (ref 1.4–7.7)
Neutrophils Relative %: 62.6 % (ref 43.0–77.0)
Platelets: 324 10*3/uL (ref 150.0–400.0)
RBC: 4.78 Mil/uL (ref 3.87–5.11)
RDW: 13 % (ref 11.5–15.5)
WBC: 7.8 10*3/uL (ref 4.0–10.5)

## 2023-07-27 LAB — BASIC METABOLIC PANEL
BUN: 7 mg/dL (ref 6–23)
CO2: 28 meq/L (ref 19–32)
Calcium: 9.5 mg/dL (ref 8.4–10.5)
Chloride: 102 meq/L (ref 96–112)
Creatinine, Ser: 0.69 mg/dL (ref 0.40–1.20)
GFR: 107.82 mL/min (ref >60.00)
Glucose, Bld: 84 mg/dL (ref 70–99)
Potassium: 4 mEq/L (ref 3.5–5.1)
Sodium: 138 meq/L (ref 135–145)

## 2023-07-27 LAB — HEPATIC FUNCTION PANEL
ALT: 12 U/L (ref 0–35)
AST: 13 U/L (ref 0–37)
Albumin: 4.3 g/dL (ref 3.5–5.2)
Alkaline Phosphatase: 56 U/L (ref 39–117)
Bilirubin, Direct: 0.1 mg/dL (ref 0.0–0.3)
Total Bilirubin: 0.5 mg/dL (ref 0.2–1.2)
Total Protein: 7.3 g/dL (ref 6.0–8.3)

## 2023-07-27 LAB — B12 AND FOLATE PANEL
Folate: 15.1 ng/mL (ref >5.9)
Vitamin B-12: 394 pg/mL (ref 211–911)

## 2023-07-27 LAB — TSH: TSH: 1.05 u[IU]/mL (ref 0.35–5.50)

## 2023-07-27 NOTE — Telephone Encounter (Signed)
Pt seen results Via my chart  

## 2023-07-27 NOTE — Progress Notes (Signed)
   Subjective:    Patient ID: Sarah [name], female    DOB: Jul 02, 1981, 42 y.o.   MRN: [id number]  HPI Dizziness- had some dizziness prior to starting BP medication.  Thought it was due to elevated pressures.  BP is much improved since last visit but continues to have dizziness.  Will check BP during dizzy spells and it has not dropped low.  Pt doesn't feel that the room is spinning or that she herself if spinning.  Trying to stay better hydrated.  Pt has started intermittent fasting.  Pt reports sxs are slowly improving.  She may have had COVID prior to onset of sxs.   Review of Systems For ROS see HPI     Objective:   Physical Exam Vitals reviewed.  Constitutional:      General: She is not in acute distress.    Appearance: Normal appearance. She is not ill-appearing.  HENT:     Head: Normocephalic and atraumatic.  Eyes:     Extraocular Movements: Extraocular movements intact.     Conjunctiva/sclera: Conjunctivae normal.     Pupils: Pupils are equal, round, and reactive to light.  Cardiovascular:     Rate and Rhythm: Normal rate and regular rhythm.  Pulmonary:     Effort: Pulmonary effort is normal. No respiratory distress.  Musculoskeletal:     Cervical back: Neck supple. No rigidity.  Skin:    General: Skin is warm and dry.  Neurological:     General: No focal deficit present.     Mental Status: She is alert and oriented to person, place, and time.     Cranial Nerves: No cranial nerve deficit.     Gait: Gait normal.  Psychiatric:        [name] and Affect: [name] normal.        Behavior: Behavior normal.        Thought Content: Thought content normal.           Assessment & Plan:   Dizziness- new.  Sxs started prior to starting Amlodipine.  She reports it's more of a 'light headed' feeling and fatigue than true dizziness.  She thinks she had COVID just prior to onset of sxs.  Also started intermittent fasting- which she has done before w/o difficulty.  Dizziness is improving  but will check labs to r/o underlying cause.  Encouraged increased fluid intake and regular eating.  Suspect this may be a viral sequela compounded by relative hypotension and hypoglycemia.

## 2023-07-27 NOTE — Patient Instructions (Signed)
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  Keep me updated! Call with any questions or concerns Hang in there!!

## 2023-07-27 NOTE — Telephone Encounter (Signed)
-----   Message from Neena Rhymes sent at 07/27/2023  2:36 PM EDT ----- Labs look great!!  No obvious cause of dizziness

## 2023-08-07 ENCOUNTER — Ambulatory Visit (INDEPENDENT_AMBULATORY_CARE_PROVIDER_SITE_OTHER): Payer: 59

## 2023-08-07 DIAGNOSIS — J309 Allergic rhinitis, unspecified: Secondary | ICD-10-CM

## 2023-08-07 NOTE — Progress Notes (Signed)
VIAL EXP 08-06-24

## 2023-08-08 DIAGNOSIS — J3081 Allergic rhinitis due to animal (cat) (dog) hair and dander: Secondary | ICD-10-CM | POA: Diagnosis not present

## 2023-08-10 ENCOUNTER — Ambulatory Visit: Payer: 59 | Admitting: Family Medicine

## 2023-08-16 NOTE — Progress Notes (Signed)
[name] [address] [place] [address] Phone: [phone] Subjective:   Sarah [name], am serving as a scribe for Dr. Antoine Primas.  I'm seeing this patient by the request  of:  Sheliah Hatch, MD  CC: Back and neck pain follow-up  QIO:[id number]  Sarah [name] is a 42 y.o. female coming in with complaint of back and neck pain. OMT 05/18/2023. Patient states that she is here for a tune up.    Pain in R medial longitudinal arch. Pain worse when more active.   Medications patient has been prescribed: None  Taking:      Reviewing patient's chart did show that patient did have an episode of dizziness that did take her to her primary care provider.   Reviewed prior external information including notes and imaging from previsou exam, outside providers and external EMR if available.   As well as notes that were available from care everywhere and other healthcare systems.  Past medical history, social, surgical and family history all reviewed in electronic medical record.  No pertanent information unless stated regarding to the chief complaint.   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 POSITIVE muscle aches  Objective  Blood pressure 110/84, pulse 71, height 5\' 1"  (1.549 m), weight 124 lb (56.2 kg), SpO2 99%.   General: No apparent distress alert and oriented x3 mood and affect normal, dressed appropriately.  HEENT: Pupils equal, extraocular movements intact  Respiratory: Patient's  speak in full sentences and does not appear short of breath  Cardiovascular: No lower extremity edema, non tender, no erythema  Gait Foot exam does show some breakdown of the transverse arch noted.  Some tenderness to palpation in the paraspinal musculature around the posterior tibialis.  Patient does have some tightness in the lower back.  Seems to be stable overall.  No significant discomfort over the piriformis which is an improvement.  Good range of motion noted as well.  Osteopathic findings  C2 flexed rotated and side bent right C7 flexed rotated and side bent left T3 extended rotated and side bent right inhaled rib T9 extended rotated and side bent left L2 flexed rotated and side bent right Sacrum right on right       Assessment and Plan:  Piriformis syndrome of left side Patient at this point seems to have piriformis syndrome resolved.  He has more of some mild muscle imbalances.  Has done extremely well with this.  Follow-up with me again in 2 to 3 months.  Continue to be active otherwise.  Tibialis posterior tendinitis, right Patient does have posterior tibialis tendinitis noted on the right side.  Discussed proper shoes, over-the-counter orthotics, icing regimen, which activities to do and which ones to avoid.  Increase activity slowly.  Follow-up again in 6 to 8 weeks worsening pain consider formal physical therapy and potential injections but likely will not be necessary.    Nonallopathic problems  Decision today to treat with OMT was based on Physical Exam  After verbal consent  patient was treated with HVLA, ME, FPR techniques in cervical, rib, thoracic, lumbar, and sacral  areas  Patient tolerated the procedure well with improvement in symptoms  Patient given exercises, stretches and lifestyle modifications  See medications in patient instructions if given  Patient will follow up in 4-8 weeks     The above documentation has been reviewed and is accurate and  complete Judi Saa, DO         Note: This dictation was prepared with Dragon dictation along with smaller phrase technology. Any transcriptional errors that result from this process are unintentional.

## 2023-08-17 ENCOUNTER — Encounter: Payer: Self-pay | Admitting: Family Medicine

## 2023-08-17 ENCOUNTER — Other Ambulatory Visit: Payer: Self-pay

## 2023-08-17 ENCOUNTER — Ambulatory Visit (INDEPENDENT_AMBULATORY_CARE_PROVIDER_SITE_OTHER): Payer: 59 | Admitting: Family Medicine

## 2023-08-17 VITALS — BP 110/84 | HR 71 | Ht 61.0 in | Wt 124.0 lb

## 2023-08-17 DIAGNOSIS — M9903 Segmental and somatic dysfunction of lumbar region: Secondary | ICD-10-CM

## 2023-08-17 DIAGNOSIS — M9904 Segmental and somatic dysfunction of sacral region: Secondary | ICD-10-CM

## 2023-08-17 DIAGNOSIS — M9908 Segmental and somatic dysfunction of rib cage: Secondary | ICD-10-CM

## 2023-08-17 DIAGNOSIS — M76821 Posterior tibial tendinitis, right leg: Secondary | ICD-10-CM | POA: Diagnosis not present

## 2023-08-17 DIAGNOSIS — M79671 Pain in right foot: Secondary | ICD-10-CM

## 2023-08-17 DIAGNOSIS — M9901 Segmental and somatic dysfunction of cervical region: Secondary | ICD-10-CM | POA: Diagnosis not present

## 2023-08-17 DIAGNOSIS — G5702 Lesion of sciatic nerve, left lower limb: Secondary | ICD-10-CM | POA: Diagnosis not present

## 2023-08-17 DIAGNOSIS — M9902 Segmental and somatic dysfunction of thoracic region: Secondary | ICD-10-CM

## 2023-08-17 NOTE — Assessment & Plan Note (Signed)
Patient at this point seems to have piriformis syndrome resolved.  He has more of some mild muscle imbalances.  Has done extremely well with this.  Follow-up with me again in 2 to 3 months.  Continue to be active otherwise.

## 2023-08-17 NOTE — Assessment & Plan Note (Signed)
Patient does have posterior tibialis tendinitis noted on the right side.  Discussed proper shoes, over-the-counter orthotics, icing regimen, which activities to do and which ones to avoid.  Increase activity slowly.  Follow-up again in 6 to 8 weeks worsening pain consider formal physical therapy and potential injections but likely will not be necessary.

## 2023-08-17 NOTE — Patient Instructions (Signed)
Do prescribed exercises at least 3x a week Spenco Total Orthotics Ice and Voltaren at end of long days Glad piriformis is resolved See you again in 2-3 months

## 2023-08-24 DIAGNOSIS — H5213 Myopia, bilateral: Secondary | ICD-10-CM | POA: Diagnosis not present

## 2023-09-05 ENCOUNTER — Ambulatory Visit (INDEPENDENT_AMBULATORY_CARE_PROVIDER_SITE_OTHER): Payer: 59 | Admitting: *Deleted

## 2023-09-05 DIAGNOSIS — J309 Allergic rhinitis, unspecified: Secondary | ICD-10-CM | POA: Diagnosis not present

## 2023-09-06 ENCOUNTER — Other Ambulatory Visit: Payer: Self-pay | Admitting: Allergy

## 2023-09-06 ENCOUNTER — Other Ambulatory Visit (HOSPITAL_BASED_OUTPATIENT_CLINIC_OR_DEPARTMENT_OTHER): Payer: Self-pay

## 2023-09-06 MED ORDER — AZELASTINE-FLUTICASONE 137-50 MCG/ACT NA SUSP
1.0000 | Freq: Two times a day (BID) | NASAL | 3 refills | Status: DC
  Filled 2023-09-14: qty 23, 30d supply, fill #0
  Filled 2024-04-22: qty 23, 30d supply, fill #1
  Filled 2024-07-08: qty 23, 30d supply, fill #2

## 2023-09-07 ENCOUNTER — Other Ambulatory Visit (HOSPITAL_BASED_OUTPATIENT_CLINIC_OR_DEPARTMENT_OTHER): Payer: Self-pay

## 2023-09-07 MED ORDER — PENICILLIN V POTASSIUM 500 MG PO TABS
1000.0000 mg | ORAL_TABLET | Freq: Four times a day (QID) | ORAL | 0 refills | Status: DC
  Filled 2023-09-07: qty 40, 5d supply, fill #0

## 2023-09-07 MED ORDER — OXYCODONE-ACETAMINOPHEN 5-325 MG PO TABS
1.0000 | ORAL_TABLET | Freq: Four times a day (QID) | ORAL | 0 refills | Status: DC
  Filled 2023-09-07: qty 12, 2d supply, fill #0

## 2023-09-07 MED ORDER — IBUPROFEN 800 MG PO TABS
800.0000 mg | ORAL_TABLET | Freq: Three times a day (TID) | ORAL | 0 refills | Status: DC | PRN
  Filled 2023-09-07: qty 30, 10d supply, fill #0

## 2023-09-07 MED ORDER — CHLORHEXIDINE GLUCONATE 0.12 % MT SOLN
15.0000 mL | Freq: Three times a day (TID) | OROMUCOSAL | 0 refills | Status: DC
  Filled 2023-09-07: qty 473, 11d supply, fill #0

## 2023-09-14 ENCOUNTER — Other Ambulatory Visit (HOSPITAL_BASED_OUTPATIENT_CLINIC_OR_DEPARTMENT_OTHER): Payer: Self-pay

## 2023-09-28 ENCOUNTER — Ambulatory Visit: Payer: 59 | Admitting: Family Medicine

## 2023-09-28 ENCOUNTER — Encounter: Payer: Self-pay | Admitting: Family Medicine

## 2023-09-28 ENCOUNTER — Other Ambulatory Visit (HOSPITAL_BASED_OUTPATIENT_CLINIC_OR_DEPARTMENT_OTHER): Payer: Self-pay

## 2023-09-28 VITALS — BP 122/78 | HR 67 | Temp 98.2°F | Ht 60.0 in | Wt 125.6 lb

## 2023-09-28 DIAGNOSIS — I10 Essential (primary) hypertension: Secondary | ICD-10-CM

## 2023-09-28 MED ORDER — AMLODIPINE BESYLATE 5 MG PO TABS
5.0000 mg | ORAL_TABLET | Freq: Every day | ORAL | 1 refills | Status: DC
  Filled 2023-09-28: qty 90, 90d supply, fill #0

## 2023-09-28 NOTE — Progress Notes (Signed)
   Subjective:    Patient ID: Sarah [name], female    DOB: April 30, 1981, 42 y.o.   MRN: [id number]  HPI HTN- pt was started on Amlodipine 5mg  daily.  BP is well controlled.  Pt reports home BP's have been well controlled.  Overall feeling much better.  No dizziness.  Has increased water intake.   Review of Systems For ROS see HPI     Objective:   Physical Exam Vitals reviewed.  Constitutional:      General: She is not in acute distress.    Appearance: Normal appearance. She is well-developed. She is not ill-appearing.  HENT:     Head: Normocephalic and atraumatic.  Eyes:     Conjunctiva/sclera: Conjunctivae normal.     Pupils: Pupils are equal, round, and reactive to light.  Neck:     Thyroid: No thyromegaly.  Cardiovascular:     Rate and Rhythm: Normal rate and regular rhythm.     Pulses: Normal pulses.     Heart sounds: Normal heart sounds. No murmur heard. Pulmonary:     Effort: Pulmonary effort is normal. No respiratory distress.     Breath sounds: Normal breath sounds.  Abdominal:     General: There is no distension.     Palpations: Abdomen is soft.     Tenderness: There is no abdominal tenderness.  Musculoskeletal:     Cervical back: Normal range of motion and neck supple.     Right lower leg: No edema.     Left lower leg: No edema.  Lymphadenopathy:     Cervical: No cervical adenopathy.  Skin:    General: Skin is warm and dry.  Neurological:     General: No focal deficit present.     Mental Status: She is alert and oriented to person, place, and time.  Psychiatric:        [name] and Affect: [name] normal.        Behavior: Behavior normal.        Thought Content: Thought content normal.           Assessment & Plan:

## 2023-09-28 NOTE — Assessment & Plan Note (Signed)
Pt's BP is well controlled and she is asymptomatic.  No need to repeat labs.  No med changes at this time

## 2023-09-28 NOTE — Patient Instructions (Signed)
Schedule your complete physical in March No med changes at this time Keep up the good work!!  You look great!! Call with any questions or concerns Stay Safe!  Stay Healthy! Happy Fall!!

## 2023-10-01 ENCOUNTER — Encounter (HOSPITAL_BASED_OUTPATIENT_CLINIC_OR_DEPARTMENT_OTHER): Payer: Self-pay

## 2023-10-01 ENCOUNTER — Other Ambulatory Visit: Payer: Self-pay

## 2023-10-01 ENCOUNTER — Ambulatory Visit (INDEPENDENT_AMBULATORY_CARE_PROVIDER_SITE_OTHER): Payer: 59

## 2023-10-01 ENCOUNTER — Emergency Department (HOSPITAL_BASED_OUTPATIENT_CLINIC_OR_DEPARTMENT_OTHER)
Admission: EM | Admit: 2023-10-01 | Discharge: 2023-10-01 | Disposition: A | Discharge status: Home / Self Care | Payer: 59 | Attending: Emergency Medicine | Admitting: Emergency Medicine

## 2023-10-01 DIAGNOSIS — J309 Allergic rhinitis, unspecified: Secondary | ICD-10-CM | POA: Diagnosis not present

## 2023-10-01 DIAGNOSIS — I10 Essential (primary) hypertension: Secondary | ICD-10-CM | POA: Insufficient documentation

## 2023-10-01 DIAGNOSIS — Z79899 Other long term (current) drug therapy: Secondary | ICD-10-CM | POA: Insufficient documentation

## 2023-10-01 DIAGNOSIS — T7840XA Allergy, unspecified, initial encounter: Secondary | ICD-10-CM | POA: Insufficient documentation

## 2023-10-01 DIAGNOSIS — R000 Tachycardia, unspecified: Secondary | ICD-10-CM | POA: Diagnosis not present

## 2023-10-01 HISTORY — DX: Essential (primary) hypertension: I10

## 2023-10-01 LAB — CBC WITH DIFFERENTIAL/PLATELET
Abs Immature Granulocytes: 0.02 10*3/uL (ref 0.00–0.07)
Basophils Absolute: 0.1 10*3/uL (ref 0.0–0.1)
Basophils Relative: 1 %
Eosinophils Absolute: 0.1 10*3/uL (ref 0.0–0.5)
Eosinophils Relative: 1 %
HCT: 43 % (ref 36.0–46.0)
Hemoglobin: 14.6 g/dL (ref 12.0–15.0)
Immature Granulocytes: 0 %
Lymphocytes Relative: 23 %
Lymphs Abs: 2.5 10*3/uL (ref 0.7–4.0)
MCH: 31.3 pg (ref 26.0–34.0)
MCHC: 34 g/dL (ref 30.0–36.0)
MCV: 92.3 fL (ref 80.0–100.0)
Monocytes Absolute: 0.4 10*3/uL (ref 0.1–1.0)
Monocytes Relative: 4 %
Neutro Abs: 8 10*3/uL — ABNORMAL HIGH (ref 1.7–7.7)
Neutrophils Relative %: 71 %
Platelets: 283 10*3/uL (ref 150–400)
RBC: 4.66 MIL/uL (ref 3.87–5.11)
RDW: 13.2 % (ref 11.5–15.5)
WBC: 11.1 10*3/uL — ABNORMAL HIGH (ref 4.0–10.5)
nRBC: 0 % (ref 0.0–0.2)

## 2023-10-01 LAB — BASIC METABOLIC PANEL
Anion gap: 11 (ref 5–15)
BUN: 9 mg/dL (ref 6–20)
CO2: 22 mmol/L (ref 22–32)
Calcium: 8.7 mg/dL — ABNORMAL LOW (ref 8.9–10.3)
Chloride: 102 mmol/L (ref 98–111)
Creatinine, Ser: 0.62 mg/dL (ref 0.44–1.00)
GFR, Estimated: >60 mL/min (ref >60)
Glucose, Bld: 107 mg/dL — ABNORMAL HIGH (ref 70–99)
Potassium: 3.2 mmol/L — ABNORMAL LOW (ref 3.5–5.1)
Sodium: 135 mmol/L (ref 135–145)

## 2023-10-01 MED ORDER — EPINEPHRINE 0.3 MG/0.3ML IJ SOAJ
0.3000 mg | Freq: Once | INTRAMUSCULAR | Status: AC
  Administered 2023-10-01: 0.3 mg via INTRAMUSCULAR
  Filled 2023-10-01: qty 0.3

## 2023-10-01 NOTE — ED Triage Notes (Addendum)
Pt reports heart palpitations, HTN, and itching 1 hour after getting her allergy shot today.  Pt took zyrtec, famotidine, claritin, and prednisone PTA.  Airway patent. Pt denies SHOB, tongue/throat swelling.

## 2023-10-01 NOTE — ED Provider Notes (Signed)
[place] EMERGENCY DEPARTMENT AT [facility] Provider Note  CSN: [id number] Arrival date & time: 10/01/23 1827  Chief Complaint(s) Allergic Reaction  HPI Sarah [name] is a 42 y.o. female here today out of concern for an allergic reaction.  Patient has an allergist, who unfortunately has allergies of her own, and received an allergy shot today.  She said that 1 hour afterwards she began to have allover itching, and felt like her heart was racing, felt like her blood pressure was high.  She took Zyrtec, famotidine, and prednisone prior to arrival.   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 Authorizing Provider  amLODipine (NORVASC) 5 MG tablet Take 1 tablet (5 mg total) by mouth daily. 09/28/23   Sheliah Hatch, MD  Azelastine-Fluticasone [phone] MCG/ACT SUSP Place 1 spray into the nose 2 (two) times daily. 09/06/23   Marcelyn Bruins, MD  EPINEPHrine (AUVI-Q) 0.3 mg/0.3 mL IJ SOAJ injection Inject 0.3 mg into the muscle as needed for anaphylaxis. 02/08/23   Marcelyn Bruins, MD  Multiple Vitamin (MULTIVITAMIN) tablet Take 1 tablet by mouth daily.    [provider]  triamcinolone ointment (KENALOG) 0.1 % Apply 1 Application topically 2 (two) times daily. 01/19/23 01/19/24  Sheliah Hatch, MD                                                                                                                                     Past Surgical History Past Surgical History:  Procedure Laterality Date   TONSILLECTOMY AND ADENOIDECTOMY  1993   Family History Family History  Problem Relation Age of Onset   Hypertension Mother    Polycythemia Mother    Allergic rhinitis Father    Healthy Daughter    Dementia Maternal Grandmother    Hyperlipidemia Maternal Grandfather    Hypertension Maternal Grandfather    Stroke Maternal Grandfather    Arthritis Paternal Grandmother    Hearing loss Paternal Grandmother    Healthy Brother    Allergic rhinitis Brother    Food Allergy Brother    Asthma Neg Hx    Angioedema Neg Hx    Eczema Neg Hx    Urticaria Neg Hx    Breast cancer Neg Hx     Social History Social History   Tobacco Use   Smoking status: Never   Smokeless  tobacco: Never  Vaping Use   Vaping status: Never Used  Substance Use Topics   Alcohol use: Yes   Drug use: Not Currently   Allergies Patient has no known allergies.  Review of Systems Review of Systems  Physical Exam Vital Signs  I have reviewed the triage vital signs BP 122/88   Pulse (!) 108   Temp 98.6 F (37 C)   Resp 16   Ht 5' (1.524 m)   Wt 56.7 kg   LMP 09/07/2023 (Exact Date)   SpO2 100%   BMI 24.41 kg/m   Physical Exam  ED Results and Treatments Labs (all labs ordered are listed, but only abnormal results are displayed) Labs Reviewed  CBC WITH DIFFERENTIAL/PLATELET - Abnormal; Notable for the following components:      Result Value   WBC 11.1 (*)    Neutro Abs 8.0 (*)    All other components within normal limits  BASIC METABOLIC PANEL - Abnormal; Notable for the following components:   Potassium 3.2 (*)    Glucose, Bld 107 (*)    Calcium 8.7 (*)    All other components within normal limits  TRYPTASE                                                                                                                          Radiology No results found.  Pertinent labs & imaging results that were available  during my care of the patient were reviewed by me and considered in my medical decision making (see MDM for details).  Medications Ordered in ED Medications  EPINEPHrine (EPI-PEN) injection 0.3 mg (0.3 mg Intramuscular Given 10/01/23 1910)                                                                                                                                     Procedures Procedures  (including critical care time)  Medical Decision Making / ED Course   This patient presents to the ED for concern of allergic reaction, this involves an extensive number of treatment options, and is a complaint that carries with it a high risk of complications and morbidity.  The differential diagnosis includes allergic reaction, less likely anaphylaxis.  MDM: Airway intact, no swelling of the oropharynx.  Patient speaking in complete sentences, clear lungs.  She does have some areas of excoriation, some redness on her left arm.  Patient has  an allergist, I asked her if she felt she needed epinephrine, she believes she did.  Will provide her with some epinephrine.  Patient requested blood work which she says would be helpful for her in the future.  Ordered.  EKG ordered for tachycardia.  Reassessment 8:20 PM-patient improved with epinephrine.  Labs reviewed, normal.  Will discharge.   Additional history obtained: -Additional history obtained from  -External records from outside source obtained and reviewed including: Chart review including previous notes, labs, imaging, consultation notes   Lab Tests: -I ordered, reviewed, and interpreted labs.   The pertinent results include:   Labs Reviewed  CBC WITH DIFFERENTIAL/PLATELET - Abnormal; Notable for the following components:      Result Value   WBC 11.1 (*)    Neutro Abs 8.0 (*)    All other components within normal limits  BASIC METABOLIC PANEL - Abnormal; Notable for the following components:   Potassium 3.2 (*)    Glucose, Bld 107 (*)     Calcium 8.7 (*)    All other components within normal limits  TRYPTASE      EKG my independent review the patient's EKG shows no ST segment depressions or elevations, no T wave versions, no evidence of acute ischemia.  EKG Interpretation Date/Time:    Ventricular Rate:    PR Interval:    QRS Duration:    QT Interval:    QTC Calculation:   R Axis:      Text Interpretation:            Medicines ordered and prescription drug management: Meds ordered this encounter  Medications   EPINEPHrine (EPI-PEN) injection 0.3 mg    -I have reviewed the patients home medicines and have made adjustments as needed     Reevaluation: After the interventions noted above, I reevaluated the patient and found that they have :improved  Co morbidities that complicate the patient evaluation  Past Medical History:  Diagnosis Date   Allergy    GERD (gastroesophageal reflux disease)    History of urinary incontinence    just after birth of children   Hypertension    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

## 2023-10-01 NOTE — ED Notes (Signed)
Pt states she had an allergy shot @1630  today  Redness at injection site (left arm) C/o palpitations, HTN and itching Pt has taken numerous meds PTA

## 2023-10-01 NOTE — Discharge Instructions (Addendum)
While you are in the emergency department, you had a CBC and BMP done.  These results were all normal.  Your tryptase was sent out, you should be able to review it in your MyChart.  Your EKG was normal.  I will defer all management of allergies to your expertise. It was nice to meet you!

## 2023-10-04 ENCOUNTER — Telehealth: Payer: Self-pay

## 2023-10-04 LAB — TRYPTASE: Tryptase: 2.2 ug/L (ref 2.2–13.2)

## 2023-10-04 NOTE — Transitions of Care (Post Inpatient/ED Visit) (Signed)
   10/04/2023  Name: Makailee Stanaway MRN: [id number] DOB: 19-Mar-1981  Today's TOC FU Call Status: Today's TOC FU Call Status:: Successful TOC FU Call Completed TOC FU Call Complete Date: 10/04/23 Patient's Name and Date of Birth confirmed.  Transition Care Management Follow-up Telephone Call Date of Discharge: 10/01/23 Discharge Facility: [facility] ([facility]) Type of Discharge: Emergency Department Reason for ED Visit: Other: (allergies) How have you been since you were released from the hospital?: Better Any questions or concerns?: No  Items Reviewed: Did you receive and understand the discharge instructions provided?: Yes Medications obtained,verified, and reconciled?: No Medications Not Reviewed Reasons:: Other: (pt did not have her list at time of call) Any new allergies since your discharge?: No Dietary orders reviewed?: NA Do you have support at home?: Yes People in Home: significant other Name of Support/Comfort Primary Source: Early Chars  Medications Reviewed Today: Medications Reviewed Today   Medications were not reviewed in this encounter     Home Care and Equipment/Supplies: Were Home Health Services Ordered?: NA Any new equipment or medical supplies ordered?: NA  Functional Questionnaire: Do you need assistance with bathing/showering or dressing?: No Do you need assistance with meal preparation?: No Do you have difficulty maintaining continence: No Do you need assistance with getting out of bed/getting out of a chair/moving?: No Do you have difficulty managing or taking your medications?: No  Follow up appointments reviewed: PCP Follow-up appointment confirmed?: No (Pt refused appt) MD Provider Line Number:[phone] Given: No Specialist Hospital Follow-up appointment confirmed?: NA Do you need transportation to your follow-up appointment?: No Do you understand care options if your condition(s) worsen?: Yes-patient verbalized  understanding    SIGNATURE [facility]

## 2023-10-05 ENCOUNTER — Encounter: Payer: Self-pay | Admitting: Family Medicine

## 2023-10-05 ENCOUNTER — Ambulatory Visit: Payer: 59 | Attending: Family Medicine

## 2023-10-05 ENCOUNTER — Ambulatory Visit: Payer: 59 | Admitting: Family Medicine

## 2023-10-05 VITALS — BP 112/74 | HR 89 | Temp 97.8°F | Ht 60.0 in | Wt 123.0 lb

## 2023-10-05 DIAGNOSIS — Z1159 Encounter for screening for other viral diseases: Secondary | ICD-10-CM

## 2023-10-05 DIAGNOSIS — Z114 Encounter for screening for human immunodeficiency virus [HIV]: Secondary | ICD-10-CM

## 2023-10-05 DIAGNOSIS — L299 Pruritus, unspecified: Secondary | ICD-10-CM

## 2023-10-05 DIAGNOSIS — R002 Palpitations: Secondary | ICD-10-CM

## 2023-10-05 DIAGNOSIS — I10 Essential (primary) hypertension: Secondary | ICD-10-CM | POA: Diagnosis not present

## 2023-10-05 LAB — HEPATIC FUNCTION PANEL
ALT: 12 U/L (ref 0–35)
AST: 13 U/L (ref 0–37)
Albumin: 4.7 g/dL (ref 3.5–5.2)
Alkaline Phosphatase: 52 U/L (ref 39–117)
Bilirubin, Direct: 0 mg/dL (ref 0.0–0.3)
Total Bilirubin: 0.6 mg/dL (ref 0.2–1.2)
Total Protein: 7.5 g/dL (ref 6.0–8.3)

## 2023-10-05 LAB — BASIC METABOLIC PANEL
BUN: 12 mg/dL (ref 6–23)
CO2: 29 meq/L (ref 19–32)
Calcium: 9.4 mg/dL (ref 8.4–10.5)
Chloride: 101 meq/L (ref 96–112)
Creatinine, Ser: 0.75 mg/dL (ref 0.40–1.20)
GFR: 98.77 mL/min (ref >60.00)
Glucose, Bld: 88 mg/dL (ref 70–99)
Potassium: 4 meq/L (ref 3.5–5.1)
Sodium: 137 meq/L (ref 135–145)

## 2023-10-05 LAB — CBC WITH DIFFERENTIAL/PLATELET
Basophils Absolute: 0 10*3/uL (ref 0.0–0.1)
Basophils Relative: 0.2 % (ref 0.0–3.0)
Eosinophils Absolute: 0.1 10*3/uL (ref 0.0–0.7)
Eosinophils Relative: 1.2 % (ref 0.0–5.0)
HCT: 46.1 % — ABNORMAL HIGH (ref 36.0–46.0)
Hemoglobin: 15.2 g/dL — ABNORMAL HIGH (ref 12.0–15.0)
Lymphocytes Relative: 32.3 % (ref 12.0–46.0)
Lymphs Abs: 2.4 10*3/uL (ref 0.7–4.0)
MCHC: 32.9 g/dL (ref 30.0–36.0)
MCV: 94.3 fL (ref 78.0–100.0)
Monocytes Absolute: 0.5 10*3/uL (ref 0.1–1.0)
Monocytes Relative: 6.6 % (ref 3.0–12.0)
Neutro Abs: 4.4 10*3/uL (ref 1.4–7.7)
Neutrophils Relative %: 59.7 % (ref 43.0–77.0)
Platelets: 348 10*3/uL (ref 150.0–400.0)
RBC: 4.89 Mil/uL (ref 3.87–5.11)
RDW: 13.6 % (ref 11.5–15.5)
WBC: 7.3 10*3/uL (ref 4.0–10.5)

## 2023-10-05 LAB — TSH: TSH: 0.99 u[IU]/mL (ref 0.35–5.50)

## 2023-10-05 NOTE — Progress Notes (Unsigned)
EP to read

## 2023-10-05 NOTE — Patient Instructions (Addendum)
Follow up in 6 weeks to recheck blood pressure We'll notify you of your lab results and make any changes if needed We'll call you to schedule your Cardiology appt I ordered a Zio cardiac monitor for you to wear for the next 2 weeks Make sure you are drinking plenty of fluids and eating regularly Continue the Amlodipine in the morning and add a 2nd dose in the afternoon depending on blood pressure Call with any questions or concerns Hang in there!!

## 2023-10-05 NOTE — Assessment & Plan Note (Signed)
BP is excellent today.  She reports BP is always normal in the morning and then rises in the afternoon.  Yesterday was 140s/90s.  Unclear if this is a stress response to work or another underlying cause.  Will add a 2nd Amlodipine dose in the afternoon as needed for elevated BP and refer to Cards.

## 2023-10-05 NOTE — Progress Notes (Signed)
   Subjective:    Patient ID: Sarah [name], female    DOB: [birth year], 42 y.o.   MRN: [id number]  HPI ER f/u- pt had an allergy shot on 11/11 and subsequently developed whole body itching, racing heart rate, and elevated BP.  She took Zyrtec Famotidine, and Prednisone prior to ER arrival.  She was given epinephrine in ER.  CBC and BMP done, EKG WNL.  BMP shows K+ 3.2 and Ca 8.7, WBC is 11.1  Pt reports since Monday BP and pulse have been fluctuating.  Will have some flushing, dizziness.  Sxs improve w/ lying down.  Having some intermittent chest pain.  Denies respiratory or GI sxs.  Reports she will feel good each morning but start to feel poorly around noon.  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  Very anxious- asking for cards referral, cardiac enzymes.     Review of Systems For ROS see HPI     Objective:   Physical Exam Vitals reviewed.  Constitutional:      General: She is not in acute distress.    Appearance: Normal appearance. She is well-developed. She is not ill-appearing.  HENT:     Head: Normocephalic and atraumatic.  Eyes:     Conjunctiva/sclera: Conjunctivae normal.     Pupils: Pupils are equal, round, and reactive to light.  Neck:     Thyroid: No thyromegaly.  Cardiovascular:     Rate and Rhythm: Normal rate and regular rhythm.     Heart sounds: Normal heart sounds. No murmur heard. Pulmonary:     Effort: Pulmonary effort is normal. No respiratory distress.     Breath sounds: Normal breath sounds.  Abdominal:     General: There is no distension.     Palpations: Abdomen is soft.     Tenderness: There is no abdominal tenderness.  Musculoskeletal:     Cervical back: Normal range of motion and neck supple.     Right lower leg: No edema.     Left lower leg: No edema.  Lymphadenopathy:     Cervical: No cervical adenopathy.  Skin:    General: Skin is warm and dry.  Neurological:     General: No focal deficit present.      Mental Status: She is alert and oriented to person, place, and time.  Psychiatric:        Behavior: Behavior normal.     Comments: anxious           Assessment & Plan:  Palpitations- new.  Pt reports 'HR will be all over the place'.  Also has fluctuating BP.  Did get epinephrine in ER on Monday.  Some flushing and dizziness- sxs resolve w/ lying down.  Given the autonomic dysfxn, will check catecholamines for possible pheo.  Will have her wear a Zio monitor to assess rhythm over time (she is asymptomatic in office today).  Refer to Cards.  Will get Troponins at pt's request.  Encouraged increased fluids and regular eating.  Pt expressed understanding and is in agreement w/ plan.   Itching- unclear if this was an allergic rxn to her allergy shot on Monday (although she has been getting them for 4 yrs) or there is another underlying cause.  Check kidney and liver fxns.  Attempt to r/o pheo.  Will follow.

## 2023-10-08 ENCOUNTER — Telehealth: Payer: Self-pay

## 2023-10-08 DIAGNOSIS — R002 Palpitations: Secondary | ICD-10-CM

## 2023-10-08 NOTE — Telephone Encounter (Signed)
-----   Message from Neena Rhymes sent at 10/07/2023  5:27 PM EST ----- Troponin is normal.  No evidence of any heart strain/damage.  Great news!

## 2023-10-12 ENCOUNTER — Ambulatory Visit: Payer: 59 | Attending: Cardiovascular Disease | Admitting: Cardiovascular Disease

## 2023-10-12 ENCOUNTER — Encounter: Payer: Self-pay | Admitting: Allergy

## 2023-10-12 ENCOUNTER — Other Ambulatory Visit (HOSPITAL_BASED_OUTPATIENT_CLINIC_OR_DEPARTMENT_OTHER): Payer: Self-pay

## 2023-10-12 ENCOUNTER — Ambulatory Visit (INDEPENDENT_AMBULATORY_CARE_PROVIDER_SITE_OTHER): Payer: 59 | Admitting: Allergy

## 2023-10-12 ENCOUNTER — Encounter: Payer: Self-pay | Admitting: Cardiovascular Disease

## 2023-10-12 VITALS — BP 130/89 | HR 72 | Ht 60.0 in | Wt 124.8 lb

## 2023-10-12 VITALS — BP 118/84 | HR 93 | Temp 98.3°F | Resp 16 | Ht 60.0 in | Wt 124.8 lb

## 2023-10-12 DIAGNOSIS — R002 Palpitations: Secondary | ICD-10-CM | POA: Diagnosis not present

## 2023-10-12 DIAGNOSIS — T781XXD Other adverse food reactions, not elsewhere classified, subsequent encounter: Secondary | ICD-10-CM

## 2023-10-12 DIAGNOSIS — J301 Allergic rhinitis due to pollen: Secondary | ICD-10-CM | POA: Diagnosis not present

## 2023-10-12 DIAGNOSIS — J4520 Mild intermittent asthma, uncomplicated: Secondary | ICD-10-CM | POA: Diagnosis not present

## 2023-10-12 DIAGNOSIS — L309 Dermatitis, unspecified: Secondary | ICD-10-CM

## 2023-10-12 DIAGNOSIS — I150 Renovascular hypertension: Secondary | ICD-10-CM

## 2023-10-12 DIAGNOSIS — H1013 Acute atopic conjunctivitis, bilateral: Secondary | ICD-10-CM | POA: Diagnosis not present

## 2023-10-12 DIAGNOSIS — I129 Hypertensive chronic kidney disease with stage 1 through stage 4 chronic kidney disease, or unspecified chronic kidney disease: Secondary | ICD-10-CM

## 2023-10-12 MED ORDER — CLOTRIMAZOLE-BETAMETHASONE 1-0.05 % EX CREA
1.0000 | TOPICAL_CREAM | Freq: Two times a day (BID) | CUTANEOUS | 0 refills | Status: DC
  Filled 2023-10-12: qty 45, 23d supply, fill #0

## 2023-10-12 MED ORDER — AMLODIPINE BESYLATE 5 MG PO TABS
7.5000 mg | ORAL_TABLET | Freq: Every day | ORAL | 3 refills | Status: DC
  Filled 2023-10-12 – 2023-11-21 (×2): qty 120, 80d supply, fill #0
  Filled 2024-02-03: qty 120, 80d supply, fill #1
  Filled 2024-05-15: qty 120, 80d supply, fill #2

## 2023-10-12 NOTE — Patient Instructions (Signed)
Medication Instructions:  Increase Amlodipine to 7.5 mg daily *If you need a refill on your cardiac medications before your next appointment, please call your pharmacy*  Testing/Procedures: Your physician has requested that you have a renal artery duplex. During this test, an ultrasound is used to evaluate blood flow to the kidneys. Allow one hour for this exam. Do not eat after midnight the day before and avoid carbonated beverages. Take your medications as you usually do.    Your physician has requested that you have an echocardiogram. Echocardiography is a painless test that uses sound waves to create images of your heart. It provides your doctor with information about the size and shape of your heart and how well your heart's chambers and valves are working. This procedure takes approximately one hour. There are no restrictions for this procedure. Please do NOT wear cologne, perfume, aftershave, or lotions (deodorant is allowed). Please arrive 15 minutes prior to your appointment time.  Please note: We ask at that you not bring children with you during ultrasound (echo/ vascular) testing. Due to room size and safety concerns, children are not allowed in the ultrasound rooms during exams. Our front office staff cannot provide observation of children in our lobby area while testing is being conducted. An adult accompanying a patient to their appointment will only be allowed in the ultrasound room at the discretion of the ultrasound technician under special circumstances. We apologize for any inconvenience.    Follow-Up: At [facility], you and your health needs are our priority.  As part of our continuing mission to provide you with exceptional heart care, we have created designated Provider Care Teams.  These Care Teams include your primary Cardiologist (physician) and Advanced Practice Providers (APPs -  Physician Assistants and Nurse Practitioners) who all work together to provide you  with the care you need, when you need it.  We recommend signing up for the patient portal called "MyChart".  Sign up information is provided on this After Visit Summary.  MyChart is used to connect with patients for Virtual Visits (Telemedicine).  Patients are able to view lab/test results, encounter notes, upcoming appointments, etc.  Non-urgent messages can be sent to your provider as well.   To learn more about what you can do with MyChart, go to [email or web address].    Your next appointment:    Follow up as needed  Provider:   Dr Royann Shivers

## 2023-10-12 NOTE — Progress Notes (Unsigned)
Cardiology Office Note:    Date:  10/13/2023   ID:  Sarah [name], DOB 09-04-1981, MRN [id number]  PCP:  Sheliah Hatch, MD   [facility] Providers Cardiologist:  None     Referring MD: Sheliah Hatch, MD   No chief complaint on file. Sarah [name] is a 42 y.o. female who is being seen today for the evaluation of palpitations and hypertension at the request of Sarah [name], Sarah Rima, MD.   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  She has been taking an allergy shot for many years.  1 week ago, after taking her shot as usual she began to have generalized itching, racing heartbeat and a general feeling of unwell.  Her heart rate and blood pressure continue to shoot up and she eventually went to the emergency room where she was indeed mildly tachycardic at 108 bpm, but the ECG showed sinus tachycardia and it was an otherwise normal tracing.  Her diastolic blood pressure was borderline high at 88 mmHg.  Workup in the emergency room was fairly unremarkable with exception of a borderline increase in WBC of 11.1 K and a potassium of 3.2.  She did receive an epinephrine shot and she began feeling better and paradoxically her tachycardia and blood pressure improved.  By the time her ECG was performed the heart rate was down to 97 bpm.  She has recently started treatment with amlodipine for hypertension.  She mostly has diastolic hypertension (a typical blood pressure before starting treatment would be 130-140/95-100).  She states that her father has a similar pattern of markedly elevated diastolic blood pressure, disproportionate to the elevation in systolic blood pressure.  She has noticed that her blood pressure is consistently lower in the morning  compared to the evening.  In the morning her blood pressure is often excellent around 120s/75-80, but in the afternoon her diastolic blood pressure is typically in the high 80s.  She takes her amlodipine 5 mg tablet every morning.  She has not had recent problems with fever or chills and denies any issues with chest pain or shortness of breath at rest or with usual activity.  She has not had dizziness or syncope.  She denies lower extremity edema, orthopnea, PND and has not had any focal neurological complaints.  She has not been exercising as much as she has in the past.  She is quite busy at work, and is under some increased stress at home as they are preparing to move into a new house.  Sarah [name] and her husband Sarah [name] have 2 children, ages 35 and 42.  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   Multiple Vitamin (MULTIVITAMIN) tablet Take 1 tablet by mouth daily.   triamcinolone ointment (KENALOG) 0.1 % Apply 1 Application topically 2 (two)  times daily.   [DISCONTINUED] amLODipine (NORVASC) 5 MG tablet Take 1 tablet (5 mg total) by mouth daily.     Allergies:   Patient has no known allergies.   Social History   Socioeconomic History   Marital status: Married    Spouse name: Not on file   Number of children: Not on file   Years of education: Not on file   Highest education level: Professional school degree (e.g., MD, DDS, DVM, JD)  Occupational History   Not on file  Tobacco Use   Smoking status: Never   Smokeless tobacco: Never  Vaping Use   Vaping status: Never  Used  Substance and Sexual Activity   Alcohol use: Yes   Drug use: Not Currently   Sexual activity: Yes  Other Topics Concern   Not on file  Social History Narrative   Not on file   Social Determinants of Health   Financial Resource Strain: Low Risk  (03/22/2023)   Overall Financial Resource Strain (CARDIA)    Difficulty of Paying Living Expenses: Not hard at all  Food Insecurity: No Food Insecurity (03/22/2023)   Hunger Vital Sign    Worried About Running Out of Food in the Last Year: Never true    Ran Out of Food in the Last Year: Never true  Transportation Needs: No Transportation Needs (03/22/2023)   PRAPARE - Administrator, Civil Service (Medical): No    Lack of Transportation (Non-Medical): No  Physical Activity: Insufficiently Active (03/22/2023)   Exercise Vital Sign    Days of Exercise per Week: 3 days    Minutes of Exercise per Session: 20 min  Stress: Stress Concern Present (03/22/2023)   [employer] of Occupational Health - Occupational Stress Questionnaire    Feeling of Stress : To some extent  Social Connections: Unknown (05/08/2023)   Received from [place], Henriette Combs   Social Connections    How often do you feel lonely or isolated from those around you? (Adult - for ages 27 years and over): Not on file  Recent Concern: Social Connections - Moderately Isolated (03/22/2023)   Social Connection and Isolation Panel [NHANES]    Frequency of Communication with Friends and Family: Twice a week    Frequency of Social Gatherings with Friends and Family: Once a week    Attends Religious Services: Never    Database administrator or Organizations: No    Attends Engineer, structural: Not on file    Marital Status: Married     Family History: The patient's family history includes Allergic rhinitis in her brother and father; Arthritis in her paternal grandmother; Dementia in her maternal grandmother; Food Allergy in her brother; Healthy in her brother and  daughter; Hearing loss in her paternal grandmother; Hyperlipidemia in her maternal grandfather; Hypertension in her maternal grandfather and mother; Polycythemia in her mother; Stroke in her maternal grandfather. There is no history of Asthma, Angioedema, Eczema, Urticaria, or Breast cancer.  ROS:   Please see the history of present illness.     All other systems reviewed and are negative.  EKGs/Labs/Other Studies Reviewed:    The following studies were reviewed today: Notes/Labs/ECG from ED visit 10/01/2023 EKG Interpretation Date/Time:  Friday October 12 2023 10:30:59 EST Ventricular Rate:  72 PR Interval:  170 QRS Duration:  76 QT Interval:  398 QTC Calculation: 435 R Axis:   37  Text Interpretation: Normal sinus rhythm Normal ECG When compared with ECG of 01-Oct-2023 19:18, PREVIOUS ECG  IS PRESENT Confirmed by Mikia Delaluz [phone]) on 10/12/2023 10:34:53 AM    Recent Labs: 10/05/2023: ALT 12; BUN 12; Creatinine, Ser 0.75; Hemoglobin 15.2; Platelets 348.0; Potassium 4.0; Sodium 137; TSH 0.99  Recent Lipid Panel No results found for: "CHOL", "TRIG", "HDL", "CHOLHDL", "VLDL", "LDLCALC", "LDLDIRECT"   Risk Assessment/Calculations:                Physical Exam:    VS:  BP 130/89   Pulse 72   Ht 5' (1.524 m)   Wt 124 lb 12.8 oz (56.6 kg)   LMP 09/07/2023 (Exact Date)   SpO2 98%   BMI 24.37 kg/m     Wt Readings from Last 3 Encounters:  10/12/23 124 lb 12.8 oz (56.6 kg)  10/12/23 124 lb 12.8 oz (56.6 kg)  10/05/23 123 lb (55.8 kg)     GEN: Appears lean and fit, well nourished, well developed in no acute distress HEENT: Normal NECK: No JVD; No carotid bruits LYMPHATICS: No lymphadenopathy CARDIAC: RRR, no murmurs, rubs, gallops RESPIRATORY:  Clear to auscultation without rales, wheezing or rhonchi  ABDOMEN: Soft, non-tender, non-distended MUSCULOSKELETAL:  No edema; No deformity  SKIN: Warm and dry NEUROLOGIC:  Alert and oriented x 3 PSYCHIATRIC:  Normal  affect   ASSESSMENT:    1. Renovascular hypertension   2. 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  Her primary physician has already initiated workup for pheochromocytoma.  It is interesting that she had a low potassium when she came to the emergency room so we Sarah [name] screen for renal artery stenosis with a renal duplex ultrasound.  Would also be useful to get a renin aldosterone ratio.  Increase amlodipine to 7.5 mg daily. Palpitations: Suggested using a personal electronic monitoring device such as a Kardia or smart watch to record rhythm during palpitations.  Sarah [name] also check an echocardiogram for any evidence of left ventricular hypertrophy or left atrial enlargement that could be associated with arrhythmia risk.             Medication Adjustments/Labs and Tests Ordered: Current medicines are reviewed at length with the patient today.  Concerns regarding medicines are outlined above.  Orders Placed This Encounter  Procedures   EKG 12-Lead   ECHOCARDIOGRAM COMPLETE   VAS US RENAL ARTERY DUPLEX   Meds ordered this encounter  Medications   amLODipine (NORVASC) 5 MG tablet    Sig: Take 1.5 tablets (7.5 mg total) by mouth daily.    Dispense:  120 tablet    Refill:  3    Can you please half tablets for the patient    Patient Instructions  Medication Instructions:  Increase Amlodipine to 7.5 mg daily *If you need a refill on your cardiac medications before your next appointment, please call your pharmacy*  Testing/Procedures: Your physician has requested that you have a renal artery duplex. During this test, an ultrasound is used to evaluate blood flow to the kidneys. Allow  one hour for this exam. Do not eat after midnight the day before and avoid carbonated beverages. Take your medications as you usually do.    Your physician has requested that you have an echocardiogram. Echocardiography is a painless test that uses  sound waves to create images of your heart. It provides your doctor with information about the size and shape of your heart and how well your heart's chambers and valves are working. This procedure takes approximately one hour. There are no restrictions for this procedure. Please do NOT wear cologne, perfume, aftershave, or lotions (deodorant is allowed). Please arrive 15 minutes prior to your appointment time.  Please note: We ask at that you not bring children with you during ultrasound (echo/ vascular) testing. Due to room size and safety concerns, children are not allowed in the ultrasound rooms during exams. Our front office staff cannot provide observation of children in our lobby area while testing is being conducted. An adult accompanying a patient to their appointment Sarah [name] only be allowed in the ultrasound room at the discretion of the ultrasound technician under special circumstances. We apologize for any inconvenience.    Follow-Up: At [facility], you and your health needs are our priority.  As part of our continuing mission to provide you with exceptional heart care, we have created designated Provider Care Teams.  These Care Teams include your primary Cardiologist (physician) and Advanced Practice Providers (APPs -  Physician Assistants and Nurse Practitioners) who all work together to provide you with the care you need, when you need it.  We recommend signing up for the patient portal called "MyChart".  Sign up information is provided on this After Visit Summary.  MyChart is used to connect with patients for Virtual Visits (Telemedicine).  Patients are able to view lab/test results, encounter notes, upcoming appointments, etc.   Non-urgent messages can be sent to your provider as well.   To learn more about what you can do with MyChart, go to [email or web address].    Your next appointment:    Follow up as needed  Provider:   Dr Royann Shivers    Signed, Thurmon Fair, MD  10/13/2023 4:48 PM    [facility]

## 2023-10-12 NOTE — Patient Instructions (Addendum)
Allergic rhinitis with conjunctivitis  - environmental allergy skin testing today is positive to tree pollens, weed pollen, minor outdoor mold  - allergen avoidance measures provided  - use Xyzal 5mg  daily as needed   - use Nasonex 2 sprays each nostril daily as needed.  Recommend use for 1-2 weeks at time before stopping once symptoms improve.    - use nasal saline spray or gel to help keep nose moisturized and decrease risk of nosebleeds  - use Optivar eye drop 1 eye twice a day as needed for itchy/red/watery eyes  - continue allergen immunotherapy per protocol.  Once current vial is complete will tailor to just the pollens.   Adverse food reaction   - skin testing to select foods is positive to soybean (however is a bit smaller than previous testing)  - continue soy avoidance  - have access to self-injectable epinephrine (Epipen or AuviQ) 0.3mg  at all times  - follow emergency action plan in case of allergic reaction  Pollen food allergy syndrome  - Has improved with allergen immunotherapy.  Continue as above  Reactive airway with illness  - lung function testing is normal!  - would recommend having access to albuterol inhaler for as needed use.  have access to albuterol inhaler 2 puffs every 4-6 hours as needed for cough/wheeze/shortness of breath/chest tightness.  May use 15-20 minutes prior to activity.   Monitor frequency of use.    Dermatitis -hyperpigmened patch on upper back may have fungal component - try betamethasome/clotrimazole cream twice a day for a week at a time.  Make note of improvement  Follow-up 12 months or sooner if needed.

## 2023-10-12 NOTE — Progress Notes (Signed)
Follow-up Note  RE: Sarah [name] MRN: [id number] DOB: 1981/01/20 Date of Office Visit: 10/12/2023   History of present illness: Sarah [name] is a 42 y.o. female presenting today for follow-up of allergic rhinitis with conjunctivitis, adverse food reaction, history of reactive airway with illness.  She was last seen in the office on 12/19/22 by myself.    Discussed the use of AI scribe software for clinical note transcription with the patient, who gave verbal consent to proceed.  The patient, on maintenance allergy immunotherapy, reports occasional large local reactions post-injection. She denies any systemic reactions and has been managing local reactions with antihistamines.  However she did have symptoms unclear if related to her last allergy shot where she have ED visit of symptoms of increase BP,  itching all over, felt like heart was racing.  She took zyrtec famotidine and prednisone prior to ED arrival.  In ED received epinephrine.  That injection was a reduced dose as she was finishing up the red vial that she has been tolerating.  Since she has had visits with her PCP and cardiology currently wearing a ziopatch and undergoing work-up including echocardiogram.  She has not been exercising regularly due to recent life stressors. She has noticed an improvement in her allergy symptoms, requiring less use of Xyzal and nasal sprays, even during her typical peak symptom season in the spring. She also reports an improvement in her oral allergy syndrome.  The patient has also been experiencing an unidentified skin condition, presenting as a rough, non-itchy patch on her upper back. She has tried various treatments including triamcinolone, hydrocortisone, Eucrisa, and opzelura, but none have been effective.  She has been avoiding soy products. She has access to epinephrine device.    She has not needed to use albuterol with any illness since last visit.   Review of systems: 10pt ROS negative unless  noted above in HPI   All other systems negative unless noted above in HPI  Past medical/social/surgical/family history have been reviewed and are unchanged unless specifically indicated below.  No changes  Medication List: Current Outpatient Medications  Medication Sig Dispense Refill   amLODipine (NORVASC) 5 MG tablet Take 1.5 tablets (7.5 mg total) by mouth daily. 120 tablet 3   Azelastine-Fluticasone 137-50 MCG/ACT SUSP Place 1 spray into the nose 2 (two) times daily. 23 g 3   clotrimazole-betamethasone (LOTRISONE) cream Apply 1 Application topically in the morning and at bedtime. Twice daily for 1 week. 45 g 0   Multiple Vitamin (MULTIVITAMIN) tablet Take 1 tablet by mouth daily.     triamcinolone ointment (KENALOG) 0.1 % Apply 1 Application topically 2 (two) times daily. 30 g 1   EPINEPHrine (AUVI-Q) 0.3 mg/0.3 mL IJ SOAJ injection Inject 0.3 mg into the muscle as needed for anaphylaxis. (Patient not taking: Reported on 10/12/2023) 4 each 2   No current facility-administered medications for this visit.     Known medication allergies: No Known Allergies   Physical examination: Blood pressure 118/84, pulse 93, temperature 98.3 F (36.8 C), resp. rate 16, height 5' (1.524 m), weight 124 lb 12.8 oz (56.6 kg), last menstrual period 09/07/2023, SpO2 100%.  General: Alert, interactive, in no acute distress. HEENT: PERRLA, TMs pearly gray, turbinates non-edematous without discharge, post-pharynx non erythematous. Neck: Supple without lymphadenopathy. Lungs: Clear to auscultation without wheezing, rhonchi or rales. {no increased work of breathing. CV: Normal S1, S2 without murmurs. Abdomen: Nondistended, nontender. Skin: Hyperpigmented papules in patch on upper midback . Extremities:  No  clubbing, cyanosis or edema. Neuro:   Grossly intact.  Diagnositics/Labs:  Spirometry: FEV1: 2.24L 93%, FVC: 2.8L 97%, ratio consistent with nonobstructive pattern  Allergy testing:   Airborne  Adult Perc - 10/12/23 1603     Time Antigen Placed 1603    Allergen Manufacturer Waynette Buttery    Location Back    Number of Test 55    1. Control-Buffer 50% Glycerol Negative    2. Control-Histamine 2+    3. Bahia Negative    4. French Southern Territories Negative    5. Johnson Negative    6. Kentucky Blue Negative    7. Meadow Fescue Negative    8. Perennial Rye Negative    9. Timothy Negative    10. Ragweed Mix Negative    11. Cocklebur Negative    12. Plantain,  English Negative    13. Baccharis Negative    14. Dog Fennel Negative    15. Russian Thistle Negative    16. Lamb's Quarters Negative    17. Sheep Sorrell 2+    18. Rough Pigweed 2+    19. Marsh Elder, Rough 2+    20. Mugwort, Common Negative    21. Box, Elder 2+    22. Cedar, red Negative    23. Sweet Gum 2+    24. Pecan Pollen Negative    25. Pine Mix Negative    26. Walnut, Black Pollen 2+    27. Red Mulberry Negative    28. Ash Mix 2+    29. Birch Mix 4+    30. Beech American 3+    31. Cottonwood, Guinea-Bissau Negative    32. Hickory, White 2+    33. Maple Mix Negative    34. Oak, [place] Mix 4+    35. Sycamore Eastern Negative    36. Alternaria Alternata Negative    37. Cladosporium Herbarum Negative    38. Aspergillus Mix Negative    39. Penicillium Mix Negative    40. Bipolaris Sorokiniana (Helminthosporium) Negative    41. Drechslera Spicifera (Curvularia) Negative    42. Mucor Plumbeus Negative    43. Fusarium Moniliforme 2+    44. Aureobasidium Pullulans (pullulara) Negative    45. Rhizopus Oryzae Negative    46. Botrytis Cinera 2+    47. Epicoccum Nigrum Negative    48. Phoma Betae Negative    49. Dust Mite Mix Negative    50. Cat Hair 10,000 BAU/ml Negative    51.  Dog Epithelia Negative    52. Mixed Feathers Negative    53. Horse Epithelia Negative    54. Cockroach, German Negative    55. Tobacco Leaf Negative             Food Adult Perc - 10/12/23 1600     Time Antigen Placed 1604    Allergen  Manufacturer Waynette Buttery    Location Back    Number of allergen test 1    2. Soybean 3+   8x12            Allergy testing results were read and interpreted by provider, documented by clinical staff.   Assessment and plan: Allergic rhinitis with conjunctivitis  - environmental allergy skin testing today is positive to tree pollens, weed pollen, minor outdoor mold  - allergen avoidance measures provided  - use Xyzal 5mg  daily as needed   - use Nasonex 2 sprays each nostril daily as needed.  Recommend use for 1-2 weeks at time before stopping once symptoms improve.    -  use nasal saline spray or gel to help keep nose moisturized and decrease risk of nosebleeds  - use Optivar eye drop 1 eye twice a day as needed for itchy/red/watery eyes  - continue allergen immunotherapy per protocol.  Once current vial is complete will tailor to just the pollens.   Adverse food reaction   - skin testing to select foods is positive to soybean (however is a bit smaller than previous testing)  - continue soy avoidance  - have access to self-injectable epinephrine (Epipen or AuviQ) 0.3mg  at all times  - follow emergency action plan in case of allergic reaction  Pollen food allergy syndrome  - Has improved with allergen immunotherapy.  Continue as above  Reactive airway with illness  - no issues since last visit  - lung function testing is normal!  - would recommend having access to albuterol inhaler for as needed use.  have access to albuterol inhaler 2 puffs every 4-6 hours as needed for cough/wheeze/shortness of breath/chest tightness.  May use 15-20 minutes prior to activity.   Monitor frequency of use.    Dermatitis -hyperpigmened patch on upper back may have fungal component - try betamethasome/clotrimazole cream twice a day for a week at a time.  Make note of improvement  Follow-up 12 months or sooner if needed.    I appreciate the opportunity to take part in Sarah [name]'s care. Please do not hesitate  to contact me with questions.  Sincerely,   Margo Aye, MD Allergy/Immunology [facility]

## 2023-10-13 ENCOUNTER — Encounter: Payer: Self-pay | Admitting: Cardiovascular Disease

## 2023-10-14 LAB — HIV ANTIBODY (ROUTINE TESTING W REFLEX): HIV 1&2 Ab, 4th Generation: NONREACTIVE

## 2023-10-14 LAB — CATECHOLAMINES, FRACTIONATED, PLASMA
Dopamine: 19 pg/mL
Epinephrine: 63 pg/mL
Norepinephrine: 416 pg/mL
Total Catecholamines: 498 pg/mL

## 2023-10-14 LAB — HEPATITIS C ANTIBODY: Hepatitis C Ab: NONREACTIVE

## 2023-10-14 LAB — TROPONIN I: Troponin I: <3 ng/L (ref <=47)

## 2023-10-15 ENCOUNTER — Telehealth: Payer: Self-pay

## 2023-10-15 ENCOUNTER — Other Ambulatory Visit: Payer: Self-pay

## 2023-10-15 DIAGNOSIS — R002 Palpitations: Secondary | ICD-10-CM | POA: Diagnosis not present

## 2023-10-15 NOTE — Telephone Encounter (Signed)
-----   Message from Neena Rhymes sent at 10/15/2023  7:32 AM EST ----- Serum Catecholamines are normal.  This is good news!  Still waiting on the urine levels

## 2023-10-16 NOTE — Telephone Encounter (Signed)
Pt has reviewed via MyChart

## 2023-10-22 LAB — CATECHOLAMINES, FRACTIONATED, URINE, 24 HOUR
Calc Total (E+NE): 54 ug/(24.h) (ref 26–121)
Creatinine, Urine mg/day-CATEUR: 1.04 g/(24.h) (ref 0.50–2.15)
Dopamine 24 Hr Urine: 230 ug/(24.h) (ref 52–480)
Epinephrine, 24H, Ur: 5 ug/(24.h) (ref 2–24)
Norepinephrine, 24H, Ur: 49 ug/(24.h) (ref 15–100)
Total Volume: 1250 mL

## 2023-10-23 ENCOUNTER — Telehealth: Payer: Self-pay

## 2023-10-23 NOTE — Telephone Encounter (Signed)
-----   Message from Neena Rhymes sent at 10/22/2023  3:32 PM EST ----- Your urinary catecholamines are normal- this is great news!

## 2023-10-23 NOTE — Telephone Encounter (Signed)
Pt has reviewed via MyChart

## 2023-10-31 DIAGNOSIS — R002 Palpitations: Secondary | ICD-10-CM | POA: Diagnosis not present

## 2023-10-31 NOTE — Progress Notes (Signed)
[name] [name] [place] [address] Phone: [phone] Subjective:   Sarah [name], am serving as a scribe for Dr. Antoine Primas.  I'm seeing this patient by the request  of:  Sheliah Hatch, MD  CC: back and neck pain follow up   [name]  Sarah [name] is a 42 y.o. female coming in with complaint of back and neck pain. OMT 08/17/2023. Patient states doing well. Same per usual. L leg over IT a bit sensitive. No new concerns.  Medications patient has been prescribed: None  Taking:      Patient did see cardiology for a labile hypertension.  Being worked up for renal artery stenosis.  Scheduled for echocardiogram and renal artery duplex December 30.   Reviewed prior external information including notes and imaging from previsou exam, outside providers and external EMR if available.   As well as notes that were available from care everywhere and other healthcare systems.  Past medical history, social, surgical and family history all reviewed in electronic medical record.  No pertanent information unless stated regarding to the chief complaint.   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 POSITIVE muscle aches  Objective  Blood pressure 122/78, pulse 95, height 5' (1.524 m), weight 123 lb (55.8 kg), SpO2 98%.   General: No apparent distress alert and oriented x3 mood and affect normal, dressed appropriately.  HEENT: Pupils equal, extraocular movements intact   Respiratory: Patient's speak in full sentences and does not appear short of breath  Cardiovascular: No lower extremity edema, non tender, no erythema  Gait MSK:  Back does have some loss of lordosis noted.  Tenderness to palpation more over the left sacroiliac joint than usual.  Patient does have tightness with FABER left greater than right.  Osteopathic findings  C2 flexed rotated and side bent right C6 flexed rotated and side bent left T3 extended rotated and side bent right inhaled rib T9 extended rotated and side bent left L2 flexed rotated and side bent right Sacrum left on left       Assessment and Plan:  Piriformis syndrome of left side Continues to have some discomfort in the area.  Given her some radicular symptoms that could be potentially IT.  Stress in her life recently and is moving that is causing some poor range of motion at home.  We discussed with patient about icing regimen of home exercises, no significant changes in management at the moment.  If stress continues to well can consider medications.  Follow-up with me again in 6 to 8 weeks otherwise.    Nonallopathic problems  Decision today to treat with OMT was based on Physical Exam  After verbal consent patient was treated with HVLA, ME, FPR techniques in cervical, rib, thoracic, lumbar, and sacral  areas  Patient tolerated the procedure well with improvement in symptoms  Patient given exercises, stretches and lifestyle modifications  See medications in patient instructions if given  Patient will follow up in 4-8  weeks    The above documentation has been reviewed and is accurate and complete Judi Saa, DO          Note: This dictation was prepared with Dragon dictation along with smaller phrase technology. Any transcriptional errors that result from this process are unintentional.

## 2023-11-02 ENCOUNTER — Ambulatory Visit (INDEPENDENT_AMBULATORY_CARE_PROVIDER_SITE_OTHER): Payer: 59 | Admitting: Family Medicine

## 2023-11-02 ENCOUNTER — Encounter: Payer: Self-pay | Admitting: Family Medicine

## 2023-11-02 VITALS — BP 122/78 | HR 95 | Ht 60.0 in | Wt 123.0 lb

## 2023-11-02 DIAGNOSIS — M9903 Segmental and somatic dysfunction of lumbar region: Secondary | ICD-10-CM

## 2023-11-02 DIAGNOSIS — G5702 Lesion of sciatic nerve, left lower limb: Secondary | ICD-10-CM | POA: Diagnosis not present

## 2023-11-02 DIAGNOSIS — M9901 Segmental and somatic dysfunction of cervical region: Secondary | ICD-10-CM

## 2023-11-02 DIAGNOSIS — M9904 Segmental and somatic dysfunction of sacral region: Secondary | ICD-10-CM | POA: Diagnosis not present

## 2023-11-02 DIAGNOSIS — M9902 Segmental and somatic dysfunction of thoracic region: Secondary | ICD-10-CM | POA: Diagnosis not present

## 2023-11-02 DIAGNOSIS — M9908 Segmental and somatic dysfunction of rib cage: Secondary | ICD-10-CM

## 2023-11-02 NOTE — Assessment & Plan Note (Signed)
Continues to have some discomfort in the area.  Given her some radicular symptoms that could be potentially IT.  Stress in her life recently and is moving that is causing some poor range of motion at home.  We discussed with patient about icing regimen of home exercises, no significant changes in management at the moment.  If stress continues to well can consider medications.  Follow-up with me again in 6 to 8 weeks otherwise.

## 2023-11-02 NOTE — Patient Instructions (Signed)
Good to see you! Good luck with new house See you again in 5-6 weeks

## 2023-11-05 ENCOUNTER — Telehealth: Payer: Self-pay

## 2023-11-05 NOTE — Telephone Encounter (Signed)
-----   Message from Neena Rhymes sent at 11/03/2023  7:25 PM EST ----- No A fib or arrhythmia noted.  This is great news!

## 2023-11-05 NOTE — Telephone Encounter (Signed)
Pt has been notified.

## 2023-11-09 ENCOUNTER — Other Ambulatory Visit (HOSPITAL_BASED_OUTPATIENT_CLINIC_OR_DEPARTMENT_OTHER): Payer: Self-pay

## 2023-11-09 MED ORDER — IBUPROFEN 800 MG PO TABS
800.0000 mg | ORAL_TABLET | Freq: Four times a day (QID) | ORAL | 2 refills | Status: DC | PRN
  Filled 2023-11-09: qty 20, 5d supply, fill #0

## 2023-11-09 MED ORDER — METHYLPREDNISOLONE 4 MG PO TBPK
ORAL_TABLET | ORAL | 0 refills | Status: DC
  Filled 2023-11-09: qty 21, 6d supply, fill #0

## 2023-11-09 MED ORDER — HYDROCODONE-ACETAMINOPHEN 7.5-325 MG PO TABS
1.0000 | ORAL_TABLET | Freq: Four times a day (QID) | ORAL | 0 refills | Status: DC | PRN
  Filled 2023-11-09: qty 12, 3d supply, fill #0

## 2023-11-09 MED ORDER — AMOXICILLIN 500 MG PO CAPS
500.0000 mg | ORAL_CAPSULE | Freq: Three times a day (TID) | ORAL | 0 refills | Status: DC
  Filled 2023-11-09: qty 21, 7d supply, fill #0

## 2023-11-16 ENCOUNTER — Other Ambulatory Visit: Payer: Self-pay | Admitting: Cardiovascular Disease

## 2023-11-16 ENCOUNTER — Ambulatory Visit: Payer: 59 | Admitting: Family Medicine

## 2023-11-16 DIAGNOSIS — I150 Renovascular hypertension: Secondary | ICD-10-CM

## 2023-11-16 DIAGNOSIS — R002 Palpitations: Secondary | ICD-10-CM

## 2023-11-16 DIAGNOSIS — R0600 Dyspnea, unspecified: Secondary | ICD-10-CM

## 2023-11-19 ENCOUNTER — Ambulatory Visit (HOSPITAL_COMMUNITY)
Admission: RE | Admit: 2023-11-19 | Discharge: 2023-11-19 | Disposition: A | Discharge status: Home / Self Care | Payer: 59 | Source: Ambulatory Visit | Attending: Internal Medicine | Admitting: Internal Medicine

## 2023-11-19 ENCOUNTER — Ambulatory Visit (HOSPITAL_BASED_OUTPATIENT_CLINIC_OR_DEPARTMENT_OTHER)
Admission: RE | Admit: 2023-11-19 | Discharge: 2023-11-19 | Disposition: A | Discharge status: Home / Self Care | Payer: 59 | Source: Ambulatory Visit | Attending: Cardiovascular Disease | Admitting: Cardiovascular Disease

## 2023-11-19 ENCOUNTER — Ambulatory Visit (INDEPENDENT_AMBULATORY_CARE_PROVIDER_SITE_OTHER): Payer: Self-pay | Admitting: *Deleted

## 2023-11-19 DIAGNOSIS — R002 Palpitations: Secondary | ICD-10-CM | POA: Diagnosis not present

## 2023-11-19 DIAGNOSIS — J309 Allergic rhinitis, unspecified: Secondary | ICD-10-CM

## 2023-11-19 DIAGNOSIS — R0609 Other forms of dyspnea: Secondary | ICD-10-CM | POA: Diagnosis not present

## 2023-11-19 DIAGNOSIS — R0600 Dyspnea, unspecified: Secondary | ICD-10-CM | POA: Diagnosis not present

## 2023-11-19 DIAGNOSIS — I150 Renovascular hypertension: Secondary | ICD-10-CM | POA: Diagnosis not present

## 2023-11-19 LAB — ECHOCARDIOGRAM COMPLETE
AR max vel: 2.45 cm2
AV Area VTI: 2.29 cm2
AV Area mean vel: 2.24 cm2
AV Mean grad: 2 mm[Hg]
AV Peak grad: 3.9 mm[Hg]
Ao pk vel: 0.99 m/s
Area-P 1/2: 3.99 cm2
MV M vel: 1.05 m/s
MV Peak grad: 4.4 mm[Hg]
S' Lateral: 3.2 cm

## 2023-11-20 ENCOUNTER — Ambulatory Visit: Payer: 59 | Admitting: Family Medicine

## 2023-11-20 ENCOUNTER — Encounter: Payer: Self-pay | Admitting: Family Medicine

## 2023-11-20 VITALS — BP 98/68 | HR 73 | Temp 98.0°F | Ht 60.0 in | Wt 124.1 lb

## 2023-11-20 DIAGNOSIS — I10 Essential (primary) hypertension: Secondary | ICD-10-CM | POA: Diagnosis not present

## 2023-11-20 NOTE — Assessment & Plan Note (Signed)
Chronic problem.  BP excellently controlled today.  Asymptomatic.  So far Zio monitor, renal artery [place], and ECHO are all normal.  No changes at this time.  Will continue to follow.

## 2023-11-20 NOTE — Patient Instructions (Signed)
Follow up as needed or as scheduled Your BP looks great!!  No med changes at this time Keep up the good work on healthy diet and regular exercise- you look great!!! Call with any questions or concerns Stay Safe!  Stay Healthy! Happy New Year!!

## 2023-11-20 NOTE — Progress Notes (Signed)
   Subjective:    Patient ID: Orlan Cramp, female    DOB: 12-13-1980, 42 y.o.   MRN: [id number]  HPI HTN- recent dx.  Since I las saw her, cardiology increased Amlodipine  to 7.5mg  daily.  Pt reports overall doing well.  Some days will feel 'bad' and checks BP and it's running high.  On 12/22 was 150/90 but came down to 135.  No CP, SOB.  Not sure if palpitations have improved.  No swelling of hands/feet.  ECHO and renal US  normal.   Review of Systems For ROS see HPI     Objective:   Physical Exam Vitals reviewed.  Constitutional:      General: She is not in acute distress.    Appearance: Normal appearance. She is well-developed. She is not ill-appearing.  HENT:     Head: Normocephalic and atraumatic.  Eyes:     Conjunctiva/sclera: Conjunctivae normal.     Pupils: Pupils are equal, round, and reactive to light.  Neck:     Thyroid : No thyromegaly.  Cardiovascular:     Rate and Rhythm: Normal rate and regular rhythm.     Pulses: Normal pulses.     Heart sounds: Normal heart sounds. No murmur heard. Pulmonary:     Effort: Pulmonary effort is normal. No respiratory distress.     Breath sounds: Normal breath sounds.  Abdominal:     General: There is no distension.     Palpations: Abdomen is soft.     Tenderness: There is no abdominal tenderness.  Musculoskeletal:     Cervical back: Normal range of motion and neck supple.     Right lower leg: No edema.     Left lower leg: No edema.  Lymphadenopathy:     Cervical: No cervical adenopathy.  Skin:    General: Skin is warm and dry.  Neurological:     General: No focal deficit present.     Mental Status: She is alert and oriented to person, place, and time.  Psychiatric:        Mood and Affect: Mood normal.        Behavior: Behavior normal.        Thought Content: Thought content normal.           Assessment & Plan:

## 2023-11-21 ENCOUNTER — Other Ambulatory Visit (HOSPITAL_COMMUNITY): Payer: Self-pay

## 2023-11-22 ENCOUNTER — Other Ambulatory Visit: Payer: Self-pay

## 2023-11-26 ENCOUNTER — Ambulatory Visit (INDEPENDENT_AMBULATORY_CARE_PROVIDER_SITE_OTHER): Payer: Self-pay | Admitting: *Deleted

## 2023-11-26 DIAGNOSIS — J309 Allergic rhinitis, unspecified: Secondary | ICD-10-CM

## 2023-11-28 ENCOUNTER — Telehealth: Payer: Self-pay | Admitting: Emergency Medicine

## 2023-11-28 ENCOUNTER — Encounter: Payer: Self-pay | Admitting: Emergency Medicine

## 2023-11-28 DIAGNOSIS — N289 Disorder of kidney and ureter, unspecified: Secondary | ICD-10-CM

## 2023-11-28 DIAGNOSIS — N2889 Other specified disorders of kidney and ureter: Secondary | ICD-10-CM

## 2023-11-28 NOTE — Telephone Encounter (Signed)
 Left a message stating that I am calling about Vascular Renal u/s results. Recommendations for further testing- wanted to discuss this. Asked her to call us  back at [phone] or [phone].   MRI ordered- sent info over mychart:  Ultrasound shows no renal artery stenosis. There is an exophytic lesion (a raised growth that extends from the surface of an organ or epithelium) noted in the right kidney. Would recommend abdominal MRI w/wo contrast to evaluate further. Dr. Mona (for Dr. JAYSON)  The order for MRI has been placed and will be performed at [facility] [address] [place] in [place], [place]. They will contact you to schedule this test. Thank you Izetta, RN

## 2023-12-03 ENCOUNTER — Ambulatory Visit (INDEPENDENT_AMBULATORY_CARE_PROVIDER_SITE_OTHER): Payer: Self-pay | Admitting: *Deleted

## 2023-12-03 DIAGNOSIS — J309 Allergic rhinitis, unspecified: Secondary | ICD-10-CM

## 2023-12-12 NOTE — Progress Notes (Signed)
[name] [address] [place] [address] Phone: [phone] Subjective:   Bruce Donath, am serving as a scribe for Dr. Antoine Primas.  I'm seeing this patient by the request  of:  Sheliah Hatch, MD  CC: Back and neck pain follow-up  [id number]  Sarah [name] is a 43 y.o. female coming in with complaint of back and neck pain. OMT 11/02/2023. Patient states that she has been doing well.   C/o pain in anterior L knee. Pain is worse when standing.   Medications patient has been prescribed: None  Taking:         Reviewed prior external information including notes and imaging from previsou exam, outside providers and external EMR if available.   As well as notes that were available from care everywhere and other healthcare systems.  Past medical history, social, surgical and family history all reviewed in electronic medical record.  No pertanent information unless stated regarding to the chief complaint.   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 POSITIVE muscle aches  Objective  Blood pressure 104/72, pulse 68, height 5' (1.524 m), weight 126 lb (57.2 kg), SpO2 100%.   General: No apparent distress alert and oriented x3 mood and affect normal, dressed appropriately.  HEENT: Pupils equal, extraocular movements intact  Respiratory: Patient's speak in full sentences and does not appear short of breath  Cardiovascular: No lower extremity edema, non tender, no  erythema  Gait MSK:  Back does have some loss of lordosis  Osteopathic findings  C2 flexed rotated and side bent right C6 flexed rotated and side bent left T3 extended rotated and side bent right inhaled rib T9 extended rotated and side bent left L2 flexed rotated and side bent right Sacrum right on right       Assessment and Plan:  Piriformis syndrome of left side Continues to have tightness noted.  Discussed icing regimen and home exercises, discussed with stability.  Discussed with patient to continue home exercises and icing regimen.  Was found to have some instability of the left knee noted today that we may need to monitor.  Could be potentially contributing to some of the back pain.  Discussed icing regimen and home exercises.  Increase activity slowly otherwise.  Follow-up again in 6 to 8 weeks.    Nonallopathic problems  Decision today to treat with OMT was based on Physical Exam  After verbal consent patient was treated with HVLA, ME, FPR techniques in cervical, rib, thoracic, lumbar, and sacral  areas  Patient tolerated the procedure well with improvement in symptoms  Patient given exercises, stretches and lifestyle modifications  See medications in patient instructions if given  Patient will follow up in 4-8 weeks    The above documentation has been reviewed and is accurate and complete Judi Saa, DO          Note: This dictation was prepared with Dragon dictation along with smaller phrase technology. Any transcriptional errors that result from  this process are unintentional.

## 2023-12-14 ENCOUNTER — Encounter: Payer: Self-pay | Admitting: Family Medicine

## 2023-12-14 ENCOUNTER — Ambulatory Visit (INDEPENDENT_AMBULATORY_CARE_PROVIDER_SITE_OTHER): Payer: 59 | Admitting: Family Medicine

## 2023-12-14 VITALS — BP 104/72 | HR 68 | Ht 60.0 in | Wt 126.0 lb

## 2023-12-14 DIAGNOSIS — M9908 Segmental and somatic dysfunction of rib cage: Secondary | ICD-10-CM | POA: Diagnosis not present

## 2023-12-14 DIAGNOSIS — M9904 Segmental and somatic dysfunction of sacral region: Secondary | ICD-10-CM | POA: Diagnosis not present

## 2023-12-14 DIAGNOSIS — G5702 Lesion of sciatic nerve, left lower limb: Secondary | ICD-10-CM | POA: Diagnosis not present

## 2023-12-14 DIAGNOSIS — M9903 Segmental and somatic dysfunction of lumbar region: Secondary | ICD-10-CM | POA: Diagnosis not present

## 2023-12-14 DIAGNOSIS — M9901 Segmental and somatic dysfunction of cervical region: Secondary | ICD-10-CM | POA: Diagnosis not present

## 2023-12-14 DIAGNOSIS — M9902 Segmental and somatic dysfunction of thoracic region: Secondary | ICD-10-CM

## 2023-12-14 NOTE — Patient Instructions (Signed)
VMO strength See me again in 2 months

## 2023-12-14 NOTE — Assessment & Plan Note (Addendum)
Continues to have tightness noted.  Discussed icing regimen and home exercises, discussed with stability.  Discussed with patient to continue home exercises and icing regimen.  Was found to have some instability of the left knee noted today that we may need to monitor.  Could be potentially contributing to some of the back pain.  Discussed icing regimen and home exercises.  Increase activity slowly otherwise.  Follow-up again in 6 to 8 weeks.

## 2023-12-19 ENCOUNTER — Ambulatory Visit (INDEPENDENT_AMBULATORY_CARE_PROVIDER_SITE_OTHER): Payer: Self-pay

## 2023-12-19 DIAGNOSIS — J309 Allergic rhinitis, unspecified: Secondary | ICD-10-CM

## 2023-12-30 ENCOUNTER — Other Ambulatory Visit: Payer: Self-pay | Admitting: Family Medicine

## 2023-12-31 NOTE — Telephone Encounter (Signed)
 Requested Prescriptions   Pending Prescriptions Disp Refills   scopolamine  (TRANSDERM-SCOP) 1 MG/3DAYS [Pharmacy Med Name: Scopolamine  1mg /3 days Transdermal System] 10 patch 0    Sig: Apply 1 patch onto the skin every three days.     Date of patient request: 12/31/2023 Last office visit: 11/20/2023 Upcoming visit: 02/15/2024 Date of last refill: 01/19/2023 Last refill amount: 10

## 2024-01-02 ENCOUNTER — Ambulatory Visit (INDEPENDENT_AMBULATORY_CARE_PROVIDER_SITE_OTHER): Payer: Self-pay

## 2024-01-02 DIAGNOSIS — J309 Allergic rhinitis, unspecified: Secondary | ICD-10-CM | POA: Diagnosis not present

## 2024-01-05 ENCOUNTER — Ambulatory Visit
Admission: RE | Admit: 2024-01-05 | Discharge: 2024-01-05 | Disposition: A | Discharge status: Home / Self Care | Payer: 59 | Source: Ambulatory Visit | Attending: Internal Medicine | Admitting: Internal Medicine

## 2024-01-05 DIAGNOSIS — N281 Cyst of kidney, acquired: Secondary | ICD-10-CM | POA: Diagnosis not present

## 2024-01-05 DIAGNOSIS — N2889 Other specified disorders of kidney and ureter: Secondary | ICD-10-CM

## 2024-01-05 DIAGNOSIS — N289 Disorder of kidney and ureter, unspecified: Secondary | ICD-10-CM

## 2024-01-05 MED ORDER — GADOPICLENOL 0.5 MMOL/ML IV SOLN
6.0000 mL | Freq: Once | INTRAVENOUS | Status: AC | PRN
  Administered 2024-01-05: 6 mL via INTRAVENOUS

## 2024-01-07 ENCOUNTER — Telehealth: Payer: Self-pay | Admitting: Internal Medicine

## 2024-01-07 NOTE — Telephone Encounter (Signed)
Discussed with patient.  She is aware of findings and will request referral to urology at her visit with PCP tomorrow.

## 2024-01-07 NOTE — Telephone Encounter (Signed)
Patient of Dr. Royann Shivers Dr. Rennis Golden was covering his basket at time of initial test results, which prompted the MRI  Report from 01/05/24 is in epic.   Routed to Dr. Royann Shivers   Attempted to connect with [facility] radiology for stat call report but they were not on the line

## 2024-01-07 NOTE — Telephone Encounter (Signed)
Follow Up:      Sarah [name] is calling with a Call Report.

## 2024-01-08 ENCOUNTER — Other Ambulatory Visit: Payer: Self-pay

## 2024-01-08 ENCOUNTER — Ambulatory Visit: Payer: 59 | Admitting: Family Medicine

## 2024-01-08 ENCOUNTER — Encounter: Payer: Self-pay | Admitting: Family Medicine

## 2024-01-08 VITALS — BP 100/64 | HR 81 | Temp 98.1°F | Ht 61.0 in | Wt 125.0 lb

## 2024-01-08 DIAGNOSIS — R93429 Abnormal radiologic findings on diagnostic imaging of unspecified kidney: Secondary | ICD-10-CM

## 2024-01-08 DIAGNOSIS — N289 Disorder of kidney and ureter, unspecified: Secondary | ICD-10-CM

## 2024-01-08 DIAGNOSIS — N2889 Other specified disorders of kidney and ureter: Secondary | ICD-10-CM

## 2024-01-08 NOTE — Progress Notes (Signed)
   Subjective:    Patient ID: Sarah [name], female    DOB: [birth year], 43 y.o.   MRN: [id number]  HPI Abnormal MRI- pt had MRI on 2/15 as a f/u to [place].  MRI shows-  1. Complex bilobed cyst arising from the superior pole of the right kidney, overall dimensions 4.8 x 3.9 cm. Reticular or multiseptated internal nodular component arising from a central septation measuring 1.6 x 1.4 cm difficult to characterize for contrast enhancement although highly suspicious for at least some solid enhancing character. 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 2. No evidence of renal vein invasion, lymphadenopathy or metastatic disease in the abdomen.  She is here today for urology referral.  Denies abd pain, N/V, blood in urine.   Review of Systems For ROS see HPI     Objective:   Physical Exam Vitals reviewed.  Constitutional:      General: She is not in acute distress.    Appearance: Normal appearance. She is not ill-appearing.  HENT:     Head: Normocephalic and atraumatic.  Eyes:     Extraocular Movements: Extraocular movements intact.     Conjunctiva/sclera: Conjunctivae normal.  Cardiovascular:     Rate and Rhythm: Normal rate and regular rhythm.  Pulmonary:     Effort: Pulmonary effort is normal. No respiratory distress.  Skin:    General: Skin is warm and dry.  Neurological:     General: No focal deficit present.     Mental Status: She is alert and oriented to person, place, and time.  Psychiatric:        [name] and Affect: [name] normal.        Behavior: Behavior normal.        Thought Content: Thought content normal.           Assessment & Plan:  Abnormal MRI/Kidney lesion- MRI did not definitively identify the renal mass but it is suspicious for renal cell carcinoma.  Given this, we will refer to urology STAT for evaluation and subsequent treatment.  Pt agreeable to this next step.

## 2024-01-08 NOTE — Patient Instructions (Signed)
Follow up as needed or as scheduled We'll call you to schedule your Urology appt Drink LOTS of fluids Call with any questions or concerns Stay Safe!  Stay Healthy!

## 2024-01-11 ENCOUNTER — Ambulatory Visit: Payer: 59 | Admitting: Family Medicine

## 2024-01-14 ENCOUNTER — Ambulatory Visit: Payer: 59 | Admitting: Urology

## 2024-01-14 DIAGNOSIS — D4101 Neoplasm of uncertain behavior of right kidney: Secondary | ICD-10-CM | POA: Diagnosis not present

## 2024-02-01 ENCOUNTER — Other Ambulatory Visit: Payer: Self-pay | Admitting: Urology

## 2024-02-01 ENCOUNTER — Encounter: Payer: 59 | Admitting: Family Medicine

## 2024-02-03 ENCOUNTER — Telehealth: Payer: Self-pay | Admitting: Allergy & Immunology

## 2024-02-03 MED ORDER — OSELTAMIVIR PHOSPHATE 75 MG PO CAPS: 75.0000 mg | ORAL_CAPSULE | Freq: Two times a day (BID) | ORAL | 0 refills | Status: AC

## 2024-02-03 NOTE — Telephone Encounter (Signed)
 Patient's son has confirmed influenza. I am sending in Tamiflu in case symptoms develop.   Malachi Bonds, MD [facility]

## 2024-02-05 ENCOUNTER — Other Ambulatory Visit (HOSPITAL_COMMUNITY): Payer: Self-pay

## 2024-02-05 MED ORDER — MAGNESIUM CITRATE PO SOLN: 1.0000 | Freq: Once | ORAL | Status: AC

## 2024-02-08 ENCOUNTER — Ambulatory Visit: Payer: 59 | Admitting: Family Medicine

## 2024-02-15 ENCOUNTER — Encounter: Payer: 59 | Admitting: Family Medicine

## 2024-02-19 ENCOUNTER — Other Ambulatory Visit (HOSPITAL_COMMUNITY): Payer: Self-pay

## 2024-02-19 DIAGNOSIS — D4101 Neoplasm of uncertain behavior of right kidney: Secondary | ICD-10-CM | POA: Diagnosis not present

## 2024-02-19 DIAGNOSIS — R8271 Bacteriuria: Secondary | ICD-10-CM | POA: Diagnosis not present

## 2024-02-19 MED ORDER — AZITHROMYCIN 250 MG PO TABS
ORAL_TABLET | ORAL | 0 refills | Status: AC
  Filled 2024-02-19: qty 6, 5d supply, fill #0

## 2024-02-19 NOTE — Telephone Encounter (Signed)
 Patient has a sinus infection following influenza infection. I am sending in azithromycin.

## 2024-02-19 NOTE — Patient Instructions (Signed)
 SURGICAL WAITING ROOM VISITATION  Patients having surgery or a procedure may have no more than 2 support people in the waiting area - these visitors may rotate.    Children under the age of 52 must have an adult with them who is not the patient.  Due to an increase in RSV and influenza rates and associated hospitalizations, children ages 35 and under may not visit patients in [facility] hospitals.  Visitors with respiratory illnesses are discouraged from visiting and should remain at home.  If the patient needs to stay at the hospital during part of their recovery, the visitor guidelines for inpatient rooms apply. Pre-op nurse will coordinate an appropriate time for 1 support person to accompany patient in pre-op.  This support person may not rotate.    Please refer to the [facility] website for the visitor guidelines for Inpatients (after your surgery is over and you are in a regular room).       Your procedure is scheduled on:  03/05/2024    Report to [facility] Main Entrance    Report to admitting at   [phone]   Call this number if you have problems the morning of surgery [phone]   Do not eat food  or drink liquids :After Midnight.                            If you have questions, please contact your surgeon's office.       Oral Hygiene is also important to reduce your risk of infection.                                    Remember - BRUSH YOUR TEETH THE MORNING OF SURGERY WITH YOUR REGULAR TOOTHPASTE  DENTURES WILL BE REMOVED PRIOR TO SURGERY PLEASE DO NOT APPLY "Poly grip" OR ADHESIVES!!!   Do NOT smoke after Midnight   Stop all vitamins and herbal supplements 7 days before surgery.   Take these medicines the morning of surgery with A SIP OF WATER:  amlodipine, pepcid if needed   DO NOT TAKE ANY ORAL DIABETIC MEDICATIONS DAY OF YOUR SURGERY  Bring CPAP mask and tubing day of surgery.                              You may not have any metal on your  body including hair pins, jewelry, and body piercing             Do not wear make-up, lotions, powders, perfumes/cologne, or deodorant  Do not wear nail polish including gel and S&S, artificial/acrylic nails, or any other type of covering on natural nails including finger and toenails. If you have artificial nails, gel coating, etc. that needs to be removed by a nail salon please have this removed prior to surgery or surgery may need to be canceled/ delayed if the surgeon/ anesthesia feels like they are unable to be safely monitored.   Do not shave  48 hours prior to surgery.               Men may shave face and neck.   Do not bring valuables to the hospital. [facility] IS NOT             RESPONSIBLE   FOR VALUABLES.   Contacts, glasses, dentures or  bridgework may not be worn into surgery.   Bring small overnight bag day of surgery.   DO NOT BRING YOUR HOME MEDICATIONS TO THE HOSPITAL. PHARMACY WILL DISPENSE MEDICATIONS LISTED ON YOUR MEDICATION LIST TO YOU DURING YOUR ADMISSION IN THE HOSPITAL!    Patients discharged on the day of surgery will not be allowed to drive home.  Someone NEEDS to stay with you for the first 24 hours after anesthesia.   Special Instructions: Bring a copy of your healthcare power of attorney and living will documents the day of surgery if you haven't scanned them before.              Please read over the following fact sheets you were given: IF YOU HAVE QUESTIONS ABOUT YOUR PRE-OP INSTRUCTIONS PLEASE CALL [phone]   If you received a COVID test during your pre-op visit  it is requested that you wear a mask when out in public, stay away from anyone that may not be feeling well and notify your surgeon if you develop symptoms. If you test positive for Covid or have been in contact with anyone that has tested positive in the last 10 days please notify you surgeon.     - Preparing for Surgery Before surgery, you can play an important role.  Because  skin is not sterile, your skin needs to be as free of germs as possible.  You can reduce the number of germs on your skin by washing with CHG (chlorahexidine gluconate) soap before surgery.  CHG is an antiseptic cleaner which kills germs and bonds with the skin to continue killing germs even after washing. Please DO NOT use if you have an allergy to CHG or antibacterial soaps.  If your skin becomes reddened/irritated stop using the CHG and inform your nurse when you arrive at Short Stay. Do not shave (including legs and underarms) for at least 48 hours prior to the first CHG shower.  You may shave your face/neck. Please follow these instructions carefully:  1.  Shower with CHG Soap the night before surgery and the  morning of Surgery.  2.  If you choose to wash your hair, wash your hair first as usual with your  normal  shampoo.  3.  After you shampoo, rinse your hair and body thoroughly to remove the  shampoo.                           4.  Use CHG as you would any other liquid soap.  You can apply chg directly  to the skin and wash                       Gently with a scrungie or clean washcloth.  5.  Apply the CHG Soap to your body ONLY FROM THE NECK DOWN.   Do not use on face/ open                           Wound or open sores. Avoid contact with eyes, ears mouth and genitals (private parts).                       Wash face,  Genitals (private parts) with your normal soap.             6.  Wash thoroughly, paying special attention to the area where your surgery  will  be performed.  7.  Thoroughly rinse your body with warm water from the neck down.  8.  DO NOT shower/wash with your normal soap after using and rinsing off  the CHG Soap.                9.  Pat yourself dry with a clean towel.            10.  Wear clean pajamas.            11.  Place clean sheets on your bed the night of your first shower and do not  sleep with pets. Day of Surgery : Do not apply any lotions/deodorants the morning of  surgery.  Please wear clean clothes to the hospital/surgery center.  FAILURE TO FOLLOW THESE INSTRUCTIONS MAY RESULT IN THE CANCELLATION OF YOUR SURGERY PATIENT SIGNATURE_________________________________  NURSE SIGNATURE__________________________________  ________________________________________________________________________

## 2024-02-19 NOTE — Addendum Note (Signed)
 Addended by: Alfonse Spruce on: 02/19/2024 02:06 PM   Modules accepted: Orders

## 2024-02-19 NOTE — Progress Notes (Signed)
 Anesthesia Review:  PCP: Sarah [name] LOV 01/08/24  Cardiologist : Croituri- LOV 10/12/23   PPM/ ICD: Device Orders: Rep Notified:  Chest x-ray : EKG : 10/12/23  Echo : 11/19/23  [facility]oon[facility]- 11/03/23  Stress test: Cardiac Cath :   Activity level: can do a flight of stairs without difficutly  Sleep Study/ CPAP : none  Fasting Blood Sugar :      / Checks Blood Sugar -- times a day:    Blood Thinner/ Instructions /Last Dose: ASA / Instructions/ Last Dose :    Physician    Positive for flu on 02/04/24 treated with Tamiflu Still has slight cough per pt

## 2024-02-22 ENCOUNTER — Other Ambulatory Visit: Payer: Self-pay

## 2024-02-22 ENCOUNTER — Encounter (HOSPITAL_COMMUNITY): Payer: Self-pay

## 2024-02-22 ENCOUNTER — Encounter (HOSPITAL_COMMUNITY)
Admission: RE | Admit: 2024-02-22 | Discharge: 2024-02-22 | Disposition: A | Discharge status: Home / Self Care | Source: Ambulatory Visit | Attending: Urology | Admitting: Urology

## 2024-02-22 VITALS — BP 124/92 | HR 74 | Temp 98.4°F | Resp 16 | Ht 64.0 in | Wt 113.0 lb

## 2024-02-22 DIAGNOSIS — Z01818 Encounter for other preprocedural examination: Secondary | ICD-10-CM

## 2024-02-22 DIAGNOSIS — N281 Cyst of kidney, acquired: Secondary | ICD-10-CM | POA: Insufficient documentation

## 2024-02-22 DIAGNOSIS — Z79899 Other long term (current) drug therapy: Secondary | ICD-10-CM | POA: Insufficient documentation

## 2024-02-22 DIAGNOSIS — Z01812 Encounter for preprocedural laboratory examination: Secondary | ICD-10-CM | POA: Insufficient documentation

## 2024-02-22 DIAGNOSIS — I10 Essential (primary) hypertension: Secondary | ICD-10-CM | POA: Insufficient documentation

## 2024-02-22 DIAGNOSIS — K219 Gastro-esophageal reflux disease without esophagitis: Secondary | ICD-10-CM | POA: Diagnosis not present

## 2024-02-22 HISTORY — DX: Headache, unspecified: R51.9

## 2024-02-22 LAB — CBC
HCT: 44.9 % (ref 36.0–46.0)
Hemoglobin: 14.2 g/dL (ref 12.0–15.0)
MCH: 30.5 pg (ref 26.0–34.0)
MCHC: 31.6 g/dL (ref 30.0–36.0)
MCV: 96.6 fL (ref 80.0–100.0)
Platelets: 333 10*3/uL (ref 150–400)
RBC: 4.65 MIL/uL (ref 3.87–5.11)
RDW: 12.8 % (ref 11.5–15.5)
WBC: 6.9 10*3/uL (ref 4.0–10.5)
nRBC: 0 % (ref 0.0–0.2)

## 2024-02-22 LAB — BASIC METABOLIC PANEL WITH GFR
Anion gap: 8 (ref 5–15)
BUN: 9 mg/dL (ref 6–20)
CO2: 25 mmol/L (ref 22–32)
Calcium: 9.3 mg/dL (ref 8.9–10.3)
Chloride: 105 mmol/L (ref 98–111)
Creatinine, Ser: 0.64 mg/dL (ref 0.44–1.00)
GFR, Estimated: >60 mL/min (ref >60)
Glucose, Bld: 82 mg/dL (ref 70–99)
Potassium: 3.7 mmol/L (ref 3.5–5.1)
Sodium: 138 mmol/L (ref 135–145)

## 2024-02-23 LAB — TYPE AND SCREEN
ABO/RH(D): A POS
Antibody Screen: NEGATIVE

## 2024-02-25 NOTE — Progress Notes (Signed)
 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  She was recently evaluated by cardiologist Dr. Royann Shivers in November 2024 for new onset hypertension and palpitations.  At that time she was pending workup by her PCP for pheochromocytoma (workup ultimately negative).  Dr. Royann Shivers also ordered renal duplex ultrasound as well as echocardiogram.  Echo showed EF 50 to 55%, normal wall motion, normal RV function, no significant valvular abnormalities.  Renal duplex showed no renal artery stenosis, however there was an exophytic lesion noted in the right kidney.  This was followed up with MRI on 01/05/2024 showing complex bilobed cyst arising from the superior pole of the right kidney, overall dimensions 4.8 x 3.9 cm.  Subsequently evaluated by urology with recommendation for partial nephrectomy.  Preop labs reviewed, WNL.  EKG 10/12/2023: NSR.  Rate 72.  TTE 11/19/2023:  1. Left ventricular ejection fraction, by estimation, is 50 to 55%. Left  ventricular ejection fraction by 3D volume is 55 %. The left ventricle has  low normal function. The left ventricle has no regional wall motion  abnormalities. Left ventricular  diastolic parameters were normal.   2. Right ventricular systolic function is normal. The right ventricular  size is normal. Tricuspid regurgitation signal is inadequate for assessing  PA pressure.   3. The mitral valve is grossly normal. Trivial mitral valve  regurgitation. No evidence of mitral stenosis.   4. The aortic valve is tricuspid. Aortic valve regurgitation is not  visualized. No aortic stenosis is present.   5. The inferior vena cava is normal in size with greater than 50%  respiratory variability, suggesting right atrial pressure of 3 mmHg.    Conclusion(s)/Recommendation(s): Normal biventricular function without  evidence of hemodynamically significant valvular heart disease.   Event monitor 10/05/2023: HR 51 - 144, average 84 bpm. Rare supraventricular and ventricular ectopy. No sustained arrhythmias. No atrial fibrillation. Symptom trigger episodes correspond to sinus rhythm.    Zannie Cove [facility] Short Stay Center/Anesthesiology Phone [phone] 02/25/2024 2:15 PM

## 2024-02-25 NOTE — Anesthesia Preprocedure Evaluation (Addendum)
 Anesthesia Evaluation  Patient identified by MRN, date of birth, ID band Patient awake    Reviewed: Allergy & Precautions, H&P , NPO status , Patient's Chart, lab work & pertinent test results  Airway Mallampati: II  TM Distance: >3 FB Neck ROM: Full    Dental no notable dental hx.    Pulmonary neg pulmonary ROS   Pulmonary exam normal breath sounds clear to auscultation       Cardiovascular hypertension, Pt. on medications negative cardio ROS Normal cardiovascular exam Rhythm:Regular Rate:Normal     Neuro/Psych  Headaches  negative psych ROS   GI/Hepatic Neg liver ROS,GERD  ,,  Endo/Other  negative endocrine ROS    Renal/GU negative Renal ROS  negative genitourinary   Musculoskeletal negative musculoskeletal ROS (+)    Abdominal   Peds negative pediatric ROS (+)  Hematology negative hematology ROS (+)   Anesthesia Other Findings Renal Mass  Reproductive/Obstetrics negative OB ROS                             Anesthesia Physical Anesthesia Plan  ASA: 2  Anesthesia Plan: General   Post-op Pain Management:    Induction: Intravenous  PONV Risk Score and Plan: 3 and Ondansetron, Dexamethasone, Midazolam and Treatment may vary due to age or medical condition  Airway Management Planned: Oral ETT  Additional Equipment:   Intra-op Plan:   Post-operative Plan: Extubation in OR  Informed Consent: I have reviewed the patients History and Physical, chart, labs and discussed the procedure including the risks, benefits and alternatives for the proposed anesthesia with the patient or authorized representative who has indicated his/her understanding and acceptance.     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  She was recently evaluated by cardiologist Dr. Alvis Ba in November 2024 for new onset hypertension and palpitations.  At that time she was pending workup by her PCP for pheochromocytoma (workup ultimately negative).  Dr. Alvis Ba also ordered renal duplex ultrasound as well as echocardiogram.  Echo showed EF 50 to 55%, normal wall motion, normal RV function, no significant valvular abnormalities.  Renal duplex showed no renal artery stenosis, however there was an exophytic lesion noted in the right kidney.  This was followed up with MRI on 01/05/2024 showing complex bilobed cyst arising from the superior pole of the right kidney, overall dimensions 4.8 x 3.9 cm.  Subsequently evaluated by urology with recommendation for partial nephrectomy.  Preop labs reviewed, WNL.  EKG 10/12/2023: NSR.  Rate 72.  TTE 11/19/2023: 1. Left ventricular ejection fraction, by estimation, is 50 to 55%. Left  ventricular ejection fraction by 3D volume is 55 %. The left ventricle has  low normal function. The left ventricle has no regional wall motion  abnormalities. Left ventricular  diastolic parameters were normal.  2. Right ventricular systolic function is normal. The right ventricular  size is normal. Tricuspid regurgitation signal is inadequate for assessing  PA pressure.  3. The mitral valve is grossly normal. Trivial mitral valve  regurgitation. No evidence of mitral stenosis.  4. The aortic valve is tricuspid. Aortic valve regurgitation is not  visualized. No aortic stenosis is present.  5. The inferior vena cava is  normal in size with greater than 50%  respiratory variability, suggesting right atrial pressure of 3 mmHg.   Conclusion(s)/Recommendation(s): Normal biventricular function without  evidence of hemodynamically significant valvular heart  disease.   Event monitor 10/05/2023: HR 51 - 144, average 84 bpm. Rare supraventricular and ventricular ectopy. No sustained arrhythmias. No atrial fibrillation. Symptom trigger episodes correspond to sinus rhythm.   )        Anesthesia Quick Evaluation

## 2024-03-03 ENCOUNTER — Encounter: Payer: Self-pay | Admitting: Family Medicine

## 2024-03-03 NOTE — Telephone Encounter (Signed)
 Patient made aware of delay in reading and is happy to wait for Dr Marietta Shorter comment!

## 2024-03-05 ENCOUNTER — Other Ambulatory Visit: Payer: Self-pay

## 2024-03-05 ENCOUNTER — Observation Stay (HOSPITAL_COMMUNITY)
Admission: RE | Admit: 2024-03-05 | Discharge: 2024-03-07 | Disposition: A | Discharge status: Home / Self Care | Source: Ambulatory Visit | Attending: Urology | Admitting: Urology

## 2024-03-05 ENCOUNTER — Encounter (HOSPITAL_COMMUNITY)
Admission: RE | Disposition: A | Discharge status: Home / Self Care | Payer: Self-pay | Source: Ambulatory Visit | Attending: Urology

## 2024-03-05 ENCOUNTER — Ambulatory Visit (HOSPITAL_COMMUNITY): Payer: Self-pay | Admitting: Physician Assistant

## 2024-03-05 ENCOUNTER — Ambulatory Visit (HOSPITAL_BASED_OUTPATIENT_CLINIC_OR_DEPARTMENT_OTHER)

## 2024-03-05 ENCOUNTER — Other Ambulatory Visit (HOSPITAL_COMMUNITY): Payer: Self-pay

## 2024-03-05 ENCOUNTER — Encounter (HOSPITAL_COMMUNITY): Payer: Self-pay | Admitting: Urology

## 2024-03-05 DIAGNOSIS — R7309 Other abnormal glucose: Secondary | ICD-10-CM | POA: Diagnosis not present

## 2024-03-05 DIAGNOSIS — Z01818 Encounter for other preprocedural examination: Secondary | ICD-10-CM

## 2024-03-05 DIAGNOSIS — N2889 Other specified disorders of kidney and ureter: Secondary | ICD-10-CM | POA: Diagnosis not present

## 2024-03-05 DIAGNOSIS — I10 Essential (primary) hypertension: Secondary | ICD-10-CM | POA: Insufficient documentation

## 2024-03-05 DIAGNOSIS — C641 Malignant neoplasm of right kidney, except renal pelvis: Secondary | ICD-10-CM | POA: Diagnosis not present

## 2024-03-05 HISTORY — PX: ROBOTIC ASSITED PARTIAL NEPHRECTOMY: SHX6087

## 2024-03-05 LAB — POCT PREGNANCY, URINE: Preg Test, Ur: NEGATIVE

## 2024-03-05 LAB — HEMOGLOBIN AND HEMATOCRIT, BLOOD
HCT: 45.5 % (ref 36.0–46.0)
Hemoglobin: 14.5 g/dL (ref 12.0–15.0)

## 2024-03-05 LAB — ABO/RH: ABO/RH(D): A POS

## 2024-03-05 LAB — GLUCOSE, CAPILLARY: Glucose-Capillary: 112 mg/dL — ABNORMAL HIGH (ref 70–99)

## 2024-03-05 SURGERY — NEPHRECTOMY, PARTIAL, ROBOT-ASSISTED
Anesthesia: General | Laterality: Right

## 2024-03-05 MED ORDER — ONDANSETRON HCL 4 MG/2ML IJ SOLN
INTRAMUSCULAR | Status: DC | PRN
Start: 2024-03-05 — End: 2024-03-05
  Administered 2024-03-05: 4 mg via INTRAVENOUS

## 2024-03-05 MED ORDER — ONDANSETRON HCL 4 MG/2ML IJ SOLN: 4.0000 mg | INTRAMUSCULAR | Status: DC | PRN

## 2024-03-05 MED ORDER — MIDAZOLAM HCL 5 MG/5ML IJ SOLN
INTRAMUSCULAR | Status: DC | PRN
  Administered 2024-03-05: 2 mg via INTRAVENOUS

## 2024-03-05 MED ORDER — FENTANYL CITRATE (PF) 100 MCG/2ML IJ SOLN
INTRAMUSCULAR | Status: AC
  Filled 2024-03-05: qty 2

## 2024-03-05 MED ORDER — ONDANSETRON HCL 4 MG/2ML IJ SOLN
INTRAMUSCULAR | Status: AC
  Filled 2024-03-05: qty 4

## 2024-03-05 MED ORDER — DOCUSATE SODIUM 100 MG PO CAPS
100.0000 mg | ORAL_CAPSULE | Freq: Two times a day (BID) | ORAL | Status: DC
  Administered 2024-03-07: 100 mg via ORAL
  Filled 2024-03-05 (×3): qty 1

## 2024-03-05 MED ORDER — ACETAMINOPHEN 500 MG PO TABS
1000.0000 mg | ORAL_TABLET | Freq: Four times a day (QID) | ORAL | Status: AC
  Administered 2024-03-05 – 2024-03-06 (×3): 1000 mg via ORAL
  Filled 2024-03-05 (×3): qty 2

## 2024-03-05 MED ORDER — STERILE WATER FOR IRRIGATION IR SOLN
Status: DC | PRN
  Administered 2024-03-05: 1000 mL

## 2024-03-05 MED ORDER — OXYCODONE HCL 5 MG PO TABS
5.0000 mg | ORAL_TABLET | ORAL | Status: DC | PRN
  Administered 2024-03-05 – 2024-03-07 (×7): 5 mg via ORAL
  Filled 2024-03-05 (×7): qty 1

## 2024-03-05 MED ORDER — HYDROMORPHONE HCL 2 MG/ML IJ SOLN
INTRAMUSCULAR | Status: AC
  Filled 2024-03-05: qty 1

## 2024-03-05 MED ORDER — PROPOFOL 10 MG/ML IV BOLUS
INTRAVENOUS | Status: DC | PRN
  Administered 2024-03-05: 20 ug/kg/min via INTRAVENOUS
  Administered 2024-03-05: 150 mg via INTRAVENOUS

## 2024-03-05 MED ORDER — LIDOCAINE HCL (CARDIAC) PF 100 MG/5ML IV SOSY
PREFILLED_SYRINGE | INTRAVENOUS | Status: DC | PRN
  Administered 2024-03-05: 60 mg via INTRAVENOUS

## 2024-03-05 MED ORDER — LACTATED RINGERS IV SOLN: INTRAVENOUS | Status: DC | PRN

## 2024-03-05 MED ORDER — DEXAMETHASONE SODIUM PHOSPHATE 10 MG/ML IJ SOLN
INTRAMUSCULAR | Status: AC
  Filled 2024-03-05: qty 2

## 2024-03-05 MED ORDER — MIDAZOLAM HCL 2 MG/2ML IJ SOLN
INTRAMUSCULAR | Status: AC
  Filled 2024-03-05: qty 2

## 2024-03-05 MED ORDER — DEXAMETHASONE SODIUM PHOSPHATE 10 MG/ML IJ SOLN
INTRAMUSCULAR | Status: DC | PRN
  Administered 2024-03-05: 8 mg via INTRAVENOUS

## 2024-03-05 MED ORDER — OXYCODONE HCL 5 MG PO TABS: 5.0000 mg | ORAL_TABLET | Freq: Once | ORAL | Status: DC | PRN

## 2024-03-05 MED ORDER — DOCUSATE SODIUM 100 MG PO CAPS: 100.0000 mg | ORAL_CAPSULE | Freq: Two times a day (BID) | ORAL | Status: DC

## 2024-03-05 MED ORDER — AMISULPRIDE (ANTIEMETIC) 5 MG/2ML IV SOLN: 10.0000 mg | Freq: Once | INTRAVENOUS | Status: DC | PRN

## 2024-03-05 MED ORDER — HYDROMORPHONE HCL 1 MG/ML IJ SOLN
INTRAMUSCULAR | Status: DC | PRN
  Administered 2024-03-05: 1 mg via INTRAVENOUS
  Administered 2024-03-05 (×2): .5 mg via INTRAVENOUS

## 2024-03-05 MED ORDER — SUGAMMADEX SODIUM 200 MG/2ML IV SOLN
INTRAVENOUS | Status: DC | PRN
  Administered 2024-03-05: 200 mg via INTRAVENOUS

## 2024-03-05 MED ORDER — AMLODIPINE BESYLATE 5 MG PO TABS
7.5000 mg | ORAL_TABLET | Freq: Every day | ORAL | Status: DC
  Administered 2024-03-06 – 2024-03-07 (×2): 7.5 mg via ORAL
  Filled 2024-03-05 (×2): qty 2

## 2024-03-05 MED ORDER — SODIUM CHLORIDE (PF) 0.9 % IJ SOLN
INTRAMUSCULAR | Status: AC
  Filled 2024-03-05: qty 20

## 2024-03-05 MED ORDER — SODIUM CHLORIDE 0.9 % IV SOLN: 12.5000 mg | INTRAVENOUS | Status: DC | PRN

## 2024-03-05 MED ORDER — OXYCODONE HCL 5 MG/5ML PO SOLN: 5.0000 mg | Freq: Once | ORAL | Status: DC | PRN

## 2024-03-05 MED ORDER — PHENYLEPHRINE 80 MCG/ML (10ML) SYRINGE FOR IV PUSH (FOR BLOOD PRESSURE SUPPORT)
PREFILLED_SYRINGE | INTRAVENOUS | Status: AC
  Filled 2024-03-05: qty 10

## 2024-03-05 MED ORDER — FENTANYL CITRATE (PF) 100 MCG/2ML IJ SOLN
INTRAMUSCULAR | Status: DC | PRN
  Administered 2024-03-05 (×2): 50 ug via INTRAVENOUS

## 2024-03-05 MED ORDER — KETAMINE HCL 50 MG/5ML IJ SOSY
PREFILLED_SYRINGE | INTRAMUSCULAR | Status: AC
  Filled 2024-03-05: qty 5

## 2024-03-05 MED ORDER — ROCURONIUM BROMIDE 10 MG/ML (PF) SYRINGE
PREFILLED_SYRINGE | INTRAVENOUS | Status: AC
  Filled 2024-03-05: qty 20

## 2024-03-05 MED ORDER — HYDROMORPHONE HCL 1 MG/ML IJ SOLN: 0.5000 mg | INTRAMUSCULAR | Status: DC | PRN

## 2024-03-05 MED ORDER — FAMOTIDINE 20 MG PO TABS
20.0000 mg | ORAL_TABLET | Freq: Every day | ORAL | Status: DC | PRN
  Administered 2024-03-05 – 2024-03-06 (×2): 20 mg via ORAL
  Filled 2024-03-05 (×2): qty 1

## 2024-03-05 MED ORDER — BUPIVACAINE LIPOSOME 1.3 % IJ SUSP
INTRAMUSCULAR | Status: AC
  Filled 2024-03-05: qty 20

## 2024-03-05 MED ORDER — LIDOCAINE HCL (PF) 2 % IJ SOLN
INTRAMUSCULAR | Status: AC
  Filled 2024-03-05: qty 10

## 2024-03-05 MED ORDER — EPHEDRINE 5 MG/ML INJ
INTRAVENOUS | Status: AC
  Filled 2024-03-05: qty 5

## 2024-03-05 MED ORDER — SUCCINYLCHOLINE CHLORIDE 200 MG/10ML IV SOSY
PREFILLED_SYRINGE | INTRAVENOUS | Status: AC
Start: 2024-03-05 — End: ?
  Filled 2024-03-05: qty 10

## 2024-03-05 MED ORDER — SODIUM CHLORIDE 0.9% FLUSH
INTRAVENOUS | Status: DC | PRN
  Administered 2024-03-05: 20 mL

## 2024-03-05 MED ORDER — SODIUM CHLORIDE 0.9 % IV SOLN: INTRAVENOUS | Status: DC

## 2024-03-05 MED ORDER — CHLORHEXIDINE GLUCONATE 0.12 % MT SOLN
15.0000 mL | Freq: Once | OROMUCOSAL | Status: AC
  Administered 2024-03-05: 15 mL via OROMUCOSAL

## 2024-03-05 MED ORDER — HYDROMORPHONE HCL 1 MG/ML IJ SOLN
INTRAMUSCULAR | Status: AC
  Filled 2024-03-05: qty 1

## 2024-03-05 MED ORDER — LACTATED RINGERS IV SOLN
INTRAVENOUS | Status: DC
Start: 2024-03-05 — End: 2024-03-05

## 2024-03-05 MED ORDER — DEXMEDETOMIDINE HCL IN NACL 80 MCG/20ML IV SOLN
INTRAVENOUS | Status: DC | PRN
  Administered 2024-03-05: 8 ug via INTRAVENOUS

## 2024-03-05 MED ORDER — DIPHENHYDRAMINE HCL 12.5 MG/5ML PO ELIX: 12.5000 mg | ORAL_SOLUTION | Freq: Four times a day (QID) | ORAL | Status: DC | PRN

## 2024-03-05 MED ORDER — CEFAZOLIN SODIUM-DEXTROSE 2-4 GM/100ML-% IV SOLN
2.0000 g | INTRAVENOUS | Status: AC
  Administered 2024-03-05: 2 g via INTRAVENOUS
  Filled 2024-03-05: qty 100

## 2024-03-05 MED ORDER — PROPOFOL 10 MG/ML IV BOLUS
INTRAVENOUS | Status: AC
  Filled 2024-03-05: qty 20

## 2024-03-05 MED ORDER — HYDROMORPHONE HCL 1 MG/ML IJ SOLN
0.2500 mg | INTRAMUSCULAR | Status: DC | PRN
  Administered 2024-03-05 (×3): 0.5 mg via INTRAVENOUS

## 2024-03-05 MED ORDER — HYDROCODONE-ACETAMINOPHEN 5-325 MG PO TABS
1.0000 | ORAL_TABLET | Freq: Four times a day (QID) | ORAL | 0 refills | Status: DC | PRN
  Filled 2024-03-05: qty 20, 3d supply, fill #0

## 2024-03-05 MED ORDER — HYOSCYAMINE SULFATE 0.125 MG SL SUBL: 0.1250 mg | SUBLINGUAL_TABLET | SUBLINGUAL | Status: DC | PRN

## 2024-03-05 MED ORDER — BUPIVACAINE LIPOSOME 1.3 % IJ SUSP
INTRAMUSCULAR | Status: DC | PRN
  Administered 2024-03-05: 20 mL

## 2024-03-05 MED ORDER — DIPHENHYDRAMINE HCL 50 MG/ML IJ SOLN: 12.5000 mg | Freq: Four times a day (QID) | INTRAMUSCULAR | Status: DC | PRN

## 2024-03-05 MED ORDER — KETAMINE HCL 10 MG/ML IJ SOLN
INTRAMUSCULAR | Status: DC | PRN
  Administered 2024-03-05: 20 mg via INTRAVENOUS
  Administered 2024-03-05: 10 mg via INTRAVENOUS

## 2024-03-05 MED ORDER — ORAL CARE MOUTH RINSE: 15.0000 mL | Freq: Once | OROMUCOSAL | Status: AC

## 2024-03-05 MED ORDER — ROCURONIUM BROMIDE 100 MG/10ML IV SOLN
INTRAVENOUS | Status: DC | PRN
  Administered 2024-03-05: 20 mg via INTRAVENOUS
  Administered 2024-03-05: 60 mg via INTRAVENOUS
  Administered 2024-03-05: 20 mg via INTRAVENOUS

## 2024-03-05 SURGICAL SUPPLY — 72 items
APPLICATOR SURGIFLO ENDO (HEMOSTASIS) ×1 IMPLANT
BAG COUNTER SPONGE SURGICOUNT (BAG) IMPLANT
CHLORAPREP W/TINT 26 (MISCELLANEOUS) ×1 IMPLANT
CLIP LIGATING HEM O LOK PURPLE (MISCELLANEOUS) ×2 IMPLANT
CLIP LIGATING HEMO LOK XL GOLD (MISCELLANEOUS) IMPLANT
CLIP LIGATING HEMO O LOK GREEN (MISCELLANEOUS) ×1 IMPLANT
CLIP SUT LAPRA TY ABSORB (SUTURE) ×1 IMPLANT
COVER SURGICAL LIGHT HANDLE (MISCELLANEOUS) ×1 IMPLANT
COVER TIP SHEARS 8 DVNC (MISCELLANEOUS) ×1 IMPLANT
CUTTER ECHEON FLEX ENDO 45 340 (ENDOMECHANICALS) IMPLANT
DERMABOND ADVANCED .7 DNX12 (GAUZE/BANDAGES/DRESSINGS) ×1 IMPLANT
DRAIN CHANNEL 15F RND FF 3/16 (WOUND CARE) ×1 IMPLANT
DRAPE ARM DVNC X/XI (DISPOSABLE) ×4 IMPLANT
DRAPE COLUMN DVNC XI (DISPOSABLE) ×1 IMPLANT
DRAPE INCISE IOBAN 66X45 STRL (DRAPES) ×1 IMPLANT
DRAPE SHEET LG 3/4 BI-LAMINATE (DRAPES) ×1 IMPLANT
DRIVER NDL LRG 8 DVNC XI (INSTRUMENTS) ×2 IMPLANT
DRIVER NDLE LRG 8 DVNC XI (INSTRUMENTS) ×2 IMPLANT
DRSG TEGADERM 4X4.75 (GAUZE/BANDAGES/DRESSINGS) ×1 IMPLANT
ELECT PENCIL ROCKER SW 15FT (MISCELLANEOUS) ×1 IMPLANT
ELECT REM PT RETURN 15FT ADLT (MISCELLANEOUS) ×1 IMPLANT
EVACUATOR SILICONE 100CC (DRAIN) ×1 IMPLANT
FORCEPS BPLR FENES DVNC XI (FORCEP) ×1 IMPLANT
FORCEPS PROGRASP DVNC XI (FORCEP) ×1 IMPLANT
GAUZE 4X4 16PLY ~~LOC~~+RFID DBL (SPONGE) ×1 IMPLANT
GAUZE SPONGE 2X2 8PLY STRL LF (GAUZE/BANDAGES/DRESSINGS) ×1 IMPLANT
GLOVE BIO SURGEON STRL SZ 6.5 (GLOVE) ×1 IMPLANT
GLOVE SURG LX STRL 7.5 STRW (GLOVE) ×2 IMPLANT
GOWN STRL REUS W/ TWL XL LVL3 (GOWN DISPOSABLE) ×2 IMPLANT
GOWN STRL SURGICAL XL XLNG (GOWN DISPOSABLE) ×1 IMPLANT
HEMOSTAT SURGICEL 4X8 (HEMOSTASIS) ×1 IMPLANT
HOLDER FOLEY CATH W/STRAP (MISCELLANEOUS) ×1 IMPLANT
IRRIG SUCT STRYKERFLOW 2 WTIP (MISCELLANEOUS) ×1 IMPLANT
IRRIGATION SUCT STRKRFLW 2 WTP (MISCELLANEOUS) ×1 IMPLANT
KIT BASIN OR (CUSTOM PROCEDURE TRAY) ×1 IMPLANT
KIT TURNOVER KIT A (KITS) IMPLANT
LOOP VESSEL MAXI BLUE (MISCELLANEOUS) ×1 IMPLANT
MARKER SKIN DUAL TIP RULER LAB (MISCELLANEOUS) ×1 IMPLANT
NDL HYPO 22X1.5 SAFETY MO (MISCELLANEOUS) ×1 IMPLANT
NDL INSUFFLATION 14GA 120MM (NEEDLE) ×1 IMPLANT
NEEDLE HYPO 22X1.5 SAFETY MO (MISCELLANEOUS) ×1 IMPLANT
NEEDLE INSUFFLATION 14GA 120MM (NEEDLE) ×1 IMPLANT
PORT ACCESS TROCAR AIRSEAL 12 (TROCAR) ×1 IMPLANT
PROTECTOR NERVE ULNAR (MISCELLANEOUS) ×2 IMPLANT
RELOAD STAPLE 45 2.6 WHT THIN (STAPLE) IMPLANT
SCISSORS LAP 5X45 EPIX DISP (ENDOMECHANICALS) IMPLANT
SCISSORS MNPLR CVD DVNC XI (INSTRUMENTS) ×1 IMPLANT
SEAL UNIV 5-12 XI (MISCELLANEOUS) ×4 IMPLANT
SET TRI-LUMEN FLTR TB AIRSEAL (TUBING) ×1 IMPLANT
SOL ELECTROSURG ANTI STICK (MISCELLANEOUS) ×1 IMPLANT
SOLUTION ELECTROSURG ANTI STCK (MISCELLANEOUS) ×1 IMPLANT
SPIKE FLUID TRANSFER (MISCELLANEOUS) ×1 IMPLANT
SPONGE T-LAP 4X18 ~~LOC~~+RFID (SPONGE) ×1 IMPLANT
STAPLE RELOAD 45 WHT (STAPLE) IMPLANT
SURGIFLO W/THROMBIN 8M KIT (HEMOSTASIS) ×1 IMPLANT
SUT ETHILON 3 0 PS 1 (SUTURE) ×1 IMPLANT
SUT MNCRL AB 4-0 PS2 18 (SUTURE) ×2 IMPLANT
SUT PDS AB 1 CT1 27 (SUTURE) ×2 IMPLANT
SUT V-LOC BARB 180 2/0GR6 GS22 (SUTURE) IMPLANT
SUT VIC AB 0 CT1 27XBRD ANTBC (SUTURE) ×4 IMPLANT
SUT VIC AB 2-0 SH 27X BRD (SUTURE) ×2 IMPLANT
SUT VLOC BARB 180 ABS3/0GR12 (SUTURE) ×2 IMPLANT
SUTURE V-LC BRB 180 2/0GR6GS22 (SUTURE) IMPLANT
SUTURE VLOC BRB 180 ABS3/0GR12 (SUTURE) ×1 IMPLANT
SYS BAG RETRIEVAL 10MM (BASKET) ×1 IMPLANT
SYSTEM BAG RETRIEVAL 10MM (BASKET) ×1 IMPLANT
TOWEL OR 17X26 10 PK STRL BLUE (TOWEL DISPOSABLE) ×1 IMPLANT
TRAY FOLEY MTR SLVR 16FR STAT (SET/KITS/TRAYS/PACK) ×1 IMPLANT
TRAY LAPAROSCOPIC (CUSTOM PROCEDURE TRAY) ×1 IMPLANT
TROCAR Z THREAD OPTICAL 12X100 (TROCAR) ×1 IMPLANT
TROCAR Z-THREAD OPTICAL 5X100M (TROCAR) IMPLANT
WATER STERILE IRR 1000ML POUR (IV SOLUTION) ×2 IMPLANT

## 2024-03-05 NOTE — Brief Op Note (Signed)
 03/05/2024  10:43 AM  PATIENT:  Sarah [name]  43 y.o. female  PRE-OPERATIVE DIAGNOSIS:  RIGHT RENAL MASS  POST-OPERATIVE DIAGNOSIS:  RIGHT RENAL MASS  PROCEDURE:  Procedure(s): NEPHRECTOMY, PARTIAL, ROBOT-ASSISTED (Right)  SURGEON:  Surgeons and Role:    * Manny, Harvey Linen., MD - Primary  PHYSICIAN ASSISTANT:   ASSISTANTS: Carrolyn Clan PA   ANESTHESIA:   local and general  EBL:  50 mL   BLOOD ADMINISTERED:none  DRAINS:  1 - JP to bulb; 2 - Foley to straight drain    LOCAL MEDICATIONS USED:  MARCAINE     SPECIMEN:  Source of Specimen:  Rt renal cystic mass; base of renal mass  DISPOSITION OF SPECIMEN:  PATHOLOGY  COUNTS:  YES  TOURNIQUET:  * No tourniquets in log *  DICTATION: .Other Dictation: Dictation Number [id number]  PLAN OF CARE: Admit for overnight observation  PATIENT DISPOSITION:  PACU - hemodynamically stable.   Delay start of Pharmacological VTE agent (>24hrs) due to surgical blood loss or risk of bleeding: yes

## 2024-03-05 NOTE — Anesthesia Postprocedure Evaluation (Signed)
 Anesthesia Post Note  Patient: Sarah [name]  Procedure(s) Performed: NEPHRECTOMY, PARTIAL, ROBOT-ASSISTED (Right)     Patient location during evaluation: PACU Anesthesia Type: General Level of consciousness: awake and alert Pain management: pain level controlled Vital Signs Assessment: post-procedure vital signs reviewed and stable Respiratory status: spontaneous breathing, nonlabored ventilation and respiratory function stable Cardiovascular status: blood pressure returned to baseline and stable Postop Assessment: no apparent nausea or vomiting Anesthetic complications: no   No notable events documented.  Last Vitals:  Vitals:   03/05/24 1115 03/05/24 1130  BP: 117/89 108/77  Pulse: 79 77  Resp: 12 11  Temp:    SpO2: 100% 95%    Last Pain:  Vitals:   03/05/24 1130  TempSrc:   PainSc: 10-Worst pain ever                 Earvin Goldberg

## 2024-03-05 NOTE — H&P (Signed)
 Sarah [name] is an 43 y.o. female.    Chief Complaint: Pre-OP RIGHT Partial nephretomy  HPI:   1 - Complex RIGHT Renal Cyst - B4 5cm rt extreme upper pole cyst with 1.5area of nodular enhancement within on MRI 12/2023. Cr 0.75, contralateral kidney unremarkable. 1 artery / 2 vein (accessory lower) right renovascular anatomy.   PMH sig for non-cardiac chest pain (negative cards eval 2024, with Cr. Sarah [name]), HTN, mild GERD. 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 Her husband Sarah [name] is very involved. Her PCP is Sarah Rhymes MD.   Today " Sarah [name] " is seen to proceed with RIGHT partial nephrectomy. NO interval fevers. 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    Past Surgical History:  Procedure Laterality Date   TONSILLECTOMY AND ADENOIDECTOMY  1993    Family History  Problem Relation Age of Onset   Hypertension Mother    Polycythemia Mother    Allergic rhinitis Father    Healthy Daughter    Dementia Maternal Grandmother    Hyperlipidemia Maternal Grandfather    Hypertension Maternal Grandfather    Stroke Maternal Grandfather    Arthritis Paternal Grandmother    Hearing loss Paternal Grandmother    Healthy Brother    Allergic rhinitis Brother    Food Allergy Brother    Asthma Neg Hx    Angioedema Neg Hx    Eczema Neg Hx    Urticaria Neg Hx    Breast cancer Neg Hx    Social History:  reports that she has never smoked. She has never used smokeless tobacco. She reports current alcohol use. She reports that she does not use drugs.  Allergies: No Known Allergies  No medications prior to admission.    No results found for this or any previous visit (from the past 48 hours). No results found.  Review of Systems   Constitutional:  Negative for chills and fever.  All other systems reviewed and are negative.   There were no vitals taken for this visit. Physical Exam Vitals reviewed.  HENT:     Head: Normocephalic.  Eyes:     Pupils: Pupils are equal, round, and reactive to light.  Cardiovascular:     Rate and Rhythm: Normal rate.  Pulmonary:     Effort: Pulmonary effort is normal.  Abdominal:     General: Abdomen is flat.  Genitourinary:    Comments: No CVAT Musculoskeletal:        General: Normal range of motion.     Cervical back: Normal range of motion.  Skin:    General: Skin is warm.  Neurological:     General: No focal deficit present.     Mental Status: She is alert.  Psychiatric:        Mood and Affect: Mood normal.      Assessment/Plan  Proceed as planned with RIGHT partial nephrectomy. Risks, benefits, alternatives, expected peri-op course discussed previously and reiterated today.   Loletta Parish., MD 03/05/2024, 6:24 AM

## 2024-03-05 NOTE — Plan of Care (Signed)
   Problem: Clinical Measurements: Goal: Will remain free from infection Outcome: Progressing   Problem: Clinical Measurements: Goal: Respiratory complications will improve Outcome: Progressing   Problem: Clinical Measurements: Goal: Cardiovascular complication will be avoided Outcome: Progressing

## 2024-03-05 NOTE — Transfer of Care (Signed)
 Immediate Anesthesia Transfer of Care Note  Patient: Sarah [name]  Procedure(s) Performed: NEPHRECTOMY, PARTIAL, ROBOT-ASSISTED (Right)  Patient Location: PACU  Anesthesia Type:General  Level of Consciousness: awake, alert , oriented, and patient cooperative  Airway & Oxygen Therapy: Patient Spontanous Breathing and Patient connected to face mask oxygen  Post-op Assessment: Report given to RN and Post -op Vital signs reviewed and stable  Post vital signs: Reviewed and stable  Last Vitals:  Vitals Value Taken Time  BP 114/85 03/05/24 1100  Temp    Pulse 84 03/05/24 1104  Resp 16 03/05/24 1104  SpO2 100 % 03/05/24 1104  Vitals shown include unfiled device data.  Last Pain:  Vitals:   03/05/24 0702  TempSrc:   PainSc: 0-No pain         Complications: No notable events documented.

## 2024-03-05 NOTE — Discharge Instructions (Addendum)
1- Drain Sites - You may have some mild persistent drainage from old drain site for several days, this is normal. This can be covered with cotton gauze for convenience.  2 - Stiches - Your stitches are all dissolvable. You may notice a "loose thread" at your incisions, these are normal and require no intervention. You may cut them flush to the skin with fingernail clippers if needed for comfort.  3 - Diet - No restrictions  4 - Activity - No heavy lifting / straining (any activities that require valsalva or "bearing down") x 4 weeks. Otherwise, no restrictions.  5 - Bathing - You may shower immediately. Do not take a bath or get into swimming pool where incision sites are submersed in water x 4 weeks.   6 -  When to Call the Doctor - Call MD for any fever >102, any acute wound problems, or any severe nausea / vomiting. You can call the [facility] [phone]) 24 hours a day 365 days a year. It will roll-over to the answering service and on-call physician after hours.  You may resume aspirin, advil, aleve, vitamins, and supplements 7 days after surgery.

## 2024-03-05 NOTE — Anesthesia Procedure Notes (Signed)
 Procedure Name: Intubation Date/Time: 03/05/2024 8:37 AM  Performed by: Patt Boozer, CRNAPre-anesthesia Checklist: Patient identified, Emergency Drugs available, Suction available, Patient being monitored and Timeout performed Patient Re-evaluated:Patient Re-evaluated prior to induction Oxygen Delivery Method: Circle system utilized Preoxygenation: Pre-oxygenation with 100% oxygen Induction Type: IV induction Ventilation: Mask ventilation without difficulty Laryngoscope Size: Mac and 3 Grade View: Grade I Tube type: Oral Tube size: 7.0 mm Number of attempts: 1 Airway Equipment and Method: Stylet Placement Confirmation: ETT inserted through vocal cords under direct vision, positive ETCO2 and breath sounds checked- equal and bilateral Secured at: 21 cm Tube secured with: Tape Dental Injury: Teeth and Oropharynx as per pre-operative assessment

## 2024-03-06 ENCOUNTER — Encounter (HOSPITAL_COMMUNITY): Payer: Self-pay | Admitting: Urology

## 2024-03-06 ENCOUNTER — Other Ambulatory Visit (HOSPITAL_COMMUNITY): Payer: Self-pay

## 2024-03-06 DIAGNOSIS — C641 Malignant neoplasm of right kidney, except renal pelvis: Secondary | ICD-10-CM | POA: Diagnosis not present

## 2024-03-06 DIAGNOSIS — R7309 Other abnormal glucose: Secondary | ICD-10-CM | POA: Diagnosis not present

## 2024-03-06 DIAGNOSIS — I10 Essential (primary) hypertension: Secondary | ICD-10-CM | POA: Diagnosis not present

## 2024-03-06 LAB — BASIC METABOLIC PANEL WITH GFR
Anion gap: 6 (ref 5–15)
BUN: 9 mg/dL (ref 6–20)
CO2: 18 mmol/L — ABNORMAL LOW (ref 22–32)
Calcium: 7.2 mg/dL — ABNORMAL LOW (ref 8.9–10.3)
Chloride: 110 mmol/L (ref 98–111)
Creatinine, Ser: 0.59 mg/dL (ref 0.44–1.00)
GFR, Estimated: >60 mL/min (ref >60)
Glucose, Bld: 93 mg/dL (ref 70–99)
Potassium: 3.7 mmol/L (ref 3.5–5.1)
Sodium: 134 mmol/L — ABNORMAL LOW (ref 135–145)

## 2024-03-06 LAB — HEMOGLOBIN AND HEMATOCRIT, BLOOD
HCT: 37.6 % (ref 36.0–46.0)
Hemoglobin: 12 g/dL (ref 12.0–15.0)

## 2024-03-06 LAB — SURGICAL PATHOLOGY

## 2024-03-06 NOTE — Plan of Care (Signed)
  Problem: Clinical Measurements: Goal: Will remain free from infection Outcome: Progressing   Problem: Clinical Measurements: Goal: Diagnostic test results will improve Outcome: Progressing   Problem: Clinical Measurements: Goal: Respiratory complications will improve Outcome: Progressing   Problem: Clinical Measurements: Goal: Cardiovascular complication will be avoided Outcome: Progressing   Problem: Activity: Goal: Risk for activity intolerance will decrease Outcome: Progressing   

## 2024-03-06 NOTE — Plan of Care (Signed)
  Problem: Clinical Measurements: Goal: Ability to maintain clinical measurements within normal limits will improve Outcome: Progressing Goal: Will remain free from infection 03/06/2024 0411 by Roselinda Congress, RN Outcome: Progressing 03/06/2024 0409 by Roselinda Congress, RN Outcome: Progressing   Problem: Activity: Goal: Risk for activity intolerance will decrease 03/06/2024 0411 by Roselinda Congress, RN Outcome: Progressing 03/06/2024 0409 by Roselinda Congress, RN Outcome: Progressing   Problem: Pain Managment: Goal: General experience of comfort will improve and/or be controlled Outcome: Progressing   Problem: Bowel/Gastric: Goal: Gastrointestinal status for postoperative course will improve Outcome: Progressing

## 2024-03-06 NOTE — Progress Notes (Signed)
 1 Day Post-Op   Subjective/Chief Complaint:  1 - RIGHT Cystic Renal Mass - s/p RIGHT robotic partial nephrectomy 4/16, the day of admission, without acute complication. Admitted to 4th floor Urology service post-op. POD 1 Cr 0.6, Hgb 12 diet advanced to regular and foley removed.   Today "Sarah [name]" is progressing. Out of bed to chair, pain controlled, tollerating some PO.   Objective: Vital signs in last 24 hours: Temp:  [97.8 F (36.6 C)-99.2 F (37.3 C)] 99.1 F (37.3 C) (04/17 0949) Pulse Rate:  [69-83] 69 (04/17 0949) Resp:  [16-20] 16 (04/17 0949) BP: (100-117)/(71-83) 108/75 (04/17 0949) SpO2:  [96 %-100 %] 99 % (04/17 0949) Last BM Date : 03/04/24  Intake/Output from previous day: 04/16 0701 - 04/17 0700 In: 1530.8 [P.O.:500; I.V.:1030.8] Out: 1900 [Urine:1700; Drains:150; Blood:50] Intake/Output this shift: Total I/O In: 480 [P.O.:480] Out: -   NAD, husband at bedside Non-labored breathing on RA RRR SNTND, recent surgical sites c/d/I JP non-foul scant serosanguinous Foley with medium yellow urine NO c/c/e  Lab Results:  Recent Labs    03/05/24 1210 03/06/24 0537  HGB 14.5 12.0  HCT 45.5 37.6   BMET Recent Labs    03/06/24 0537  NA 134*  K 3.7  CL 110  CO2 18*  GLUCOSE 93  BUN 9  CREATININE 0.59  CALCIUM 7.2*   PT/INR No results for input(s): "LABPROT", "INR" in the last 72 hours. ABG No results for input(s): "PHART", "HCO3" in the last 72 hours.  Invalid input(s): "PCO2", "PO2"  Studies/Results: No results found.  Anti-infectives: Anti-infectives (From admission, onward)    Start     Dose/Rate Route Frequency Ordered Stop   03/05/24 0654  ceFAZolin (ANCEF) IVPB 2g/100 mL premix        2 g 200 mL/hr over 30 Minutes Intravenous 30 min pre-op 03/05/24 0654 03/05/24 0840       Assessment/Plan:  Doing well POD 1. DC foley, ambulate. Goals for DC discussed, likely tomorrow AM based on current progress.   Melody Spurling. 03/06/2024

## 2024-03-06 NOTE — Op Note (Signed)
 NAMEROSELENE, GRAY MEDICAL RECORD NO: [id number] ACCOUNT NO: [id number] DATE OF BIRTH: Oct 31, 1981 FACILITY: [facility] LOCATION: [id number] PHYSICIAN: Sebastian Ache, MD  Operative Report   DATE OF PROCEDURE: 03/05/2024  PREOPERATIVE DIAGNOSIS:  Right cystic renal mass.  PROCEDURE PERFORMED: 1. Robotic-assisted laparoscopic right partial nephrectomy. 2. Intraoperative ultrasound interpretation.  ESTIMATED BLOOD LOSS:  50 mL.  COMPLICATIONS:  None.  SPECIMENS: 1.  Right cystic renal mass for permanent pathology. 2.  Final deep margin for permanent pathology.  ASSISTANT:  Harrie Foreman, PA  DRAINS: 1.  Jackson-Pratt drain to bulb suction. 2.  Foley catheter to straight drain.  FINDINGS: 1.  Two artery, two vein right renal vascular anatomy. 2.  Predominantly cystic renal mass with area of increased nodularity in the mid portion on intraoperative ultrasound.  WARM ISCHEMIA TIME:  8 minutes.  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  He was found to have a cystic right renal lesion.  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  Options discussed management including surveillance protocols versus ablative therapy versus surgical extirpation we agreed upon right partial nephrectomy.  She presents for this today.  Informed  consent was obtained and placed in the medical record.  DESCRIPTION OF PROCEDURE:  The patient being identified and verified, procedure being right partial nephrectomy was confirmed.  Procedure timeout was performed.  Intravenous antibiotics administered.  General endotracheal anesthesia induced.  The patient  was placed into a right side up full flank position pulling 15 degrees of table flexion, superior arm elevated, axillary roll, sequential compression devices, bottom leg bent, and top leg straight.  A  Foley catheter was placed per urethra to straight  drain.  She was further fastened to the operating table using 3-inch tape and foam padding across the supraxiphoid chest, and pelvis.  A bean bag was deployed.  A sterile field was created by prepping and draping the patient's right flank and abdomen  using chlorhexidine gluconate.  High-flow, low-pressure pneumoperitoneum was obtained using Veress technique in the right lower quadrant after passing the aspiration and drop test.  An 8-mm robotic camera port was then placed and positioned approximately  four fingerbreadths superomedial to the umbilicus.  Laparoscopic examination of the peritoneal cavity revealed no significant adhesions and no visceral injury.  Additional ports were placed as follows: Right subcostal 8-mm robotic port, right far  lateral 8-mm robotic port approximately three fingerbreadths superomedial to the anterior superior iliac spine, right paramedian inferior robotic port approximately four fingerbreadths superior to the pubic ramus and then a 12-mm assistant port in the  midline approximately two fingerbreadths superior to the plane of the camera port, and finally a 5-mm retraction port in a subxiphoid location.  Robot was docked and passed the electronic checks.  Attention was directed at development of retroperitoneum  and release of the liver.  There were some loose submental adhesions between the inferior border of the liver.  These were carefully taken down allowing more hepatic mobility.  A self-locking grasper was brought through the 5-mm superior site acting as a  liver retractor, placing this on gentle superior traction away from the anterior superior aspect of the kidney and mass.  Attention was made lateral to the ascending colon where the area of the cecum towards the hepatic flexure was carefully mobilized  medially.  The lower pole of the kidney area  was immediately identified and placed on gentle lateral traction.   Dissection proceeded medial to this.  The ureter and gonadal vessels were located.  The ureter was kept laterally and the gonadal vessels  allowed to fall medially.  Dissection proceeded with this triangle towards the area of the renal hilum.  The renal hilum was somewhat complex and consisted of two artery, two vein renal vascular anatomy in the lower pole, accessory vein, and an upper  pole accessory artery.  These structures were circumferentially mobilized and the two arteries were marked separately with vessel loops.  Attention was then directed at identification and mobilization of the mass area.  The upper pole cystic mass was  immediately noted.  Interomedial aspect was very close to the adrenal, but not invading whatsoever and this medial plane was carefully developed allowing it to be mobilized away from the lateral aspect of the adrenal gland.  Superior and lateral  attachments were also taken down.  The mass grossly was quite cystic, but there were some obvious changes in density consistent with its heterogeneous nature.  Intraoperative ultrasound was then performed using a drop-in probe.  Intraoperative ultrasound  revealed a prominently cystic upper pole mass as anticipated.  There was an area of increased density and nodularity in the mid portion of this.  Using a combination of ultrasound and gross cues, a line for partial nephrectomy was carefully scored such  that it encompassed the area of increased nodularity.  Next, warm ischemia was achieved with a single bulldog clamp on each arterial branch.  A partial nephrectomy was performed keeping what appeared to be a rim of normal renal parenchyma with the  partial nephrectomy specimen.  This plane encompassed the more worrisome nodular cystic component.  There is a deep component of the mass, which is a simple cyst and the walls of this appeared to be noncomplex and this lower cystic area was purposely  transected.  This completely freed up  the partial nephrectomy specimen.  The specimen was placed in an EndoCatch bag for later retrieval.  A deep margin was set aside for permanent pathology.  The lower noncomplex cyst wall, a portion of this  approximately a third, remained in apposition of the upper pole kidney and this was completely fulgurated to ablate the mucosal edges.  Next, first layer renorrhaphy was performed using running 3-0 V-loc suture oversewing several small venous sinuses.  A  bolster of Surgicel was applied and three parenchymal apposition sutures of 0 Vicryl sandwiched between Hem-o-lok and Lapra-Ty were then applied, which showed excellent parenchymal apposition.  The bulldog clamps were removed for a total warm ischemia  time of 8 minutes.  Hemostasis was excellent.  Sponge and needle counts were correct.  The retroperitoneum was reapproximated over the area of renorrhaphy and kidney using running 3-0 V-Loc.  A closed suction drain was brought through the previous right  lateral most robotic port site and through the peritoneal cavity.  The robot was then undocked.  The specimen was retrieved by extending the assistant port site inferiorly for a total distance of approximately 2.5 cm, removing the partial nephrectomy  specimen and setting it aside for permanent pathology.  The retractor site was closed at the level of the fascia using figure-of-eight PDS sutures x3 followed by reapproximation of Scarpa's with running Vicryl.  All incision sites were infiltrated with  dilute lipolyzed Marcaine and closed at the level of the skin using subcuticular Monocryl followed by Dermabond.  The procedure was then  terminated.  The patient tolerated the procedure well.  No immediate periprocedural complications.  The patient was  taken to the postanesthesia care unit in stable condition.  Please note, first assistant, Carrolyn Clan was crucial for all portions of the surgery today, she provided invaluable retraction, suctioning, vascular  clamping, robotic instrument exchange, ultrasound manipulation, and general first assistance.   PUS D: 03/05/2024 10:53:30 am T: 03/06/2024 2:45:00 am  JOB: [id number]/ [id number]

## 2024-03-06 NOTE — Progress Notes (Signed)
   03/06/24 0924  TOC Brief Assessment  Insurance and Status Reviewed  Patient has primary care physician Yes  Home environment has been reviewed home w/ spouse  Prior level of function: independent  Prior/Current Home Services No current home services  Social Drivers of Health Review SDOH reviewed no interventions necessary  Readmission risk has been reviewed Yes  Transition of care needs no transition of care needs at this time

## 2024-03-07 ENCOUNTER — Other Ambulatory Visit (HOSPITAL_COMMUNITY): Payer: Self-pay

## 2024-03-07 DIAGNOSIS — I10 Essential (primary) hypertension: Secondary | ICD-10-CM | POA: Diagnosis not present

## 2024-03-07 DIAGNOSIS — R7309 Other abnormal glucose: Secondary | ICD-10-CM | POA: Diagnosis not present

## 2024-03-07 DIAGNOSIS — C641 Malignant neoplasm of right kidney, except renal pelvis: Secondary | ICD-10-CM | POA: Diagnosis not present

## 2024-03-07 MED ORDER — ACETAMINOPHEN 500 MG PO TABS
1000.0000 mg | ORAL_TABLET | Freq: Four times a day (QID) | ORAL | Status: DC | PRN
Start: 2024-03-07 — End: 2024-03-07
  Administered 2024-03-07 (×2): 1000 mg via ORAL
  Filled 2024-03-07 (×2): qty 2

## 2024-03-07 NOTE — Progress Notes (Signed)
 TOC med in a secure bag delivered to pt in room by this RN

## 2024-03-07 NOTE — Plan of Care (Signed)
  Problem: Clinical Measurements: Goal: Will remain free from infection Outcome: Adequate for Discharge   Problem: Clinical Measurements: Goal: Diagnostic test results will improve Outcome: Adequate for Discharge   Problem: Clinical Measurements: Goal: Respiratory complications will improve Outcome: Adequate for Discharge   Problem: Clinical Measurements: Goal: Cardiovascular complication will be avoided Outcome: Adequate for Discharge   Problem: Activity: Goal: Risk for activity intolerance will decrease Outcome: Adequate for Discharge

## 2024-03-07 NOTE — Discharge Summary (Signed)
 Physician Discharge Summary  Patient ID: Sarah [name] MRN: [id number] DOB/AGE: 42-Aug-1982 42 y.o.  Admit date: 03/05/2024 Discharge date: 03/07/2024  Admission Diagnoses: Right Renal Mass  Discharge Diagnoses: Right Renal Cancer  Discharged Condition: good  Hospital Course:   1 - RIGHT Cystic Renal Mass - s/p RIGHT robotic partial nephrectomy 4/16, the day of admission, without acute complication. Admitted to 4th floor Urology service post-op. POD 1 Cr 0.6, Hgb 12 diet advanced to regular and foley removed. By the late morning of POD 2, the day of discharge, she is ambulatory, pain controlled on PO meds, maintaining PO nutrition, and felt to be adequate for discharge. JP removed prior to discharge as output remained minimal after foley out. 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   Consults: None  Significant Diagnostic Studies: labs: as per above  Treatments: surgery: as per above  Discharge Exam: Blood pressure (!) 135/90, pulse 69, temperature 98.4 F (36.9 C), temperature source Oral, resp. rate (!) 22, height 5\' 4"  (1.626 m), weight 51.3 kg, SpO2 99%.  NAD, husband at bedside. Both extremely pleasant.  Non-labored breathing on RA RRR SNTND, recent surgical sites c/d/I JP non-foul scant serosanguinous fluid. Removed and dry dressing placed.  NO c/c/e  Disposition: HOME   Allergies as of 03/07/2024   No Known Allergies      Medication List     STOP taking these medications    clotrimazole -betamethasone  cream Commonly known as: LOTRISONE    ibuprofen  200 MG tablet Commonly known as: ADVIL    Vitamin D3 50 MCG (2000 UT) capsule       TAKE these medications    amLODipine  5 MG tablet Commonly known as: NORVASC  Take 1.5 tablets (7.5 mg total) by mouth daily.   Azelastine -Fluticasone  137-50 MCG/ACT Susp Place 1 spray into the nose 2 (two) times daily. What changed:  when to take this reasons to take this   docusate sodium  100 MG capsule Commonly known  as: COLACE Take 1 capsule (100 mg total) by mouth 2 (two) times daily.   EPINEPHrine  0.3 mg/0.3 mL Soaj injection Commonly known as: Auvi-Q  Inject 0.3 mg into the muscle as needed for anaphylaxis.   famotidine  20 MG tablet Commonly known as: PEPCID  Take 20 mg by mouth daily as needed for heartburn or indigestion.   HYDROcodone -acetaminophen  5-325 MG tablet Commonly known as: NORCO/VICODIN Take 1-2 tablets by mouth every 6 (six) hours as needed for moderate pain (pain score 4-6) or severe pain (pain score 7-10).        Follow-up Information     Secundino Dach Harvey Linen., MD Follow up on 03/18/2024.   Specialty: Urology Why: at 10:45 for MD visit and pathology review Contact information: [address] [place] [address] [phone]                 Signed: Melody Spurling. 03/07/2024, 10:02 AM

## 2024-03-18 DIAGNOSIS — C641 Malignant neoplasm of right kidney, except renal pelvis: Secondary | ICD-10-CM | POA: Diagnosis not present

## 2024-03-23 ENCOUNTER — Other Ambulatory Visit (HOSPITAL_BASED_OUTPATIENT_CLINIC_OR_DEPARTMENT_OTHER): Payer: Self-pay | Admitting: Family Medicine

## 2024-03-23 DIAGNOSIS — Z1231 Encounter for screening mammogram for malignant neoplasm of breast: Secondary | ICD-10-CM

## 2024-03-31 ENCOUNTER — Ambulatory Visit (HOSPITAL_BASED_OUTPATIENT_CLINIC_OR_DEPARTMENT_OTHER)
Admission: RE | Admit: 2024-03-31 | Discharge: 2024-03-31 | Disposition: A | Discharge status: Home / Self Care | Source: Ambulatory Visit | Attending: Family Medicine | Admitting: Family Medicine

## 2024-03-31 ENCOUNTER — Encounter (HOSPITAL_BASED_OUTPATIENT_CLINIC_OR_DEPARTMENT_OTHER): Payer: Self-pay | Admitting: Radiology

## 2024-03-31 DIAGNOSIS — Z1231 Encounter for screening mammogram for malignant neoplasm of breast: Secondary | ICD-10-CM | POA: Insufficient documentation

## 2024-04-17 ENCOUNTER — Encounter: Admitting: Family Medicine

## 2024-04-18 ENCOUNTER — Other Ambulatory Visit: Payer: Self-pay

## 2024-04-22 ENCOUNTER — Encounter: Payer: Self-pay | Admitting: Family Medicine

## 2024-04-22 ENCOUNTER — Other Ambulatory Visit (HOSPITAL_BASED_OUTPATIENT_CLINIC_OR_DEPARTMENT_OTHER): Payer: Self-pay

## 2024-04-22 ENCOUNTER — Ambulatory Visit (INDEPENDENT_AMBULATORY_CARE_PROVIDER_SITE_OTHER): Admitting: Family Medicine

## 2024-04-22 VITALS — BP 108/80 | HR 77 | Temp 98.4°F | Ht 64.0 in | Wt 128.1 lb

## 2024-04-22 DIAGNOSIS — I10 Essential (primary) hypertension: Secondary | ICD-10-CM | POA: Diagnosis not present

## 2024-04-22 MED ORDER — HYDROCHLOROTHIAZIDE 12.5 MG PO TABS
12.5000 mg | ORAL_TABLET | Freq: Every day | ORAL | 3 refills | Status: AC
  Filled 2024-04-22: qty 90, 90d supply, fill #0
  Filled 2024-07-02 – 2024-07-08 (×3): qty 90, 90d supply, fill #1
  Filled 2024-09-28: qty 90, 90d supply, fill #2
  Filled 2024-12-20: qty 90, 90d supply, fill #3

## 2024-04-22 NOTE — Assessment & Plan Note (Signed)
 Deteriorated.  Pt reports that BP was well controlled when she had surgery and when she was off for 4 weeks recovering.  Since going back to work, her diastolic values have been running high- typically as the day goes on at work.  She reports she is able to feel her BP climb.  Since elevated readings are typically diastolic, will start hydrochlorothiazide and decrease Amlodipine  to 5mg  daily.  If BP remains elevated, will increase Amlodipine  back to 7.5mg .  Pt expressed understanding and is in agreement w/ plan.

## 2024-04-22 NOTE — Progress Notes (Signed)
   Subjective:    Patient ID: Sarah [name], female    DOB: 30-May-1981, 43 y.o.   MRN: [id number]  HPI HTN- ongoing issue for pt.  Currently on Amlodipine  7.5mg  daily.  BP is excellent today but pt reports she will have higher readings in the afternoon and develop headaches, facial flushing, swelling of hands.  No CP, SOB, visual changes.   Review of Systems For ROS see HPI     Objective:   Physical Exam Vitals reviewed.  Constitutional:      General: She is not in acute distress.    Appearance: Normal appearance. She is well-developed. She is not ill-appearing.  HENT:     Head: Normocephalic and atraumatic.  Eyes:     Conjunctiva/sclera: Conjunctivae normal.     Pupils: Pupils are equal, round, and reactive to light.  Neck:     Thyroid : No thyromegaly.  Cardiovascular:     Rate and Rhythm: Normal rate and regular rhythm.     Pulses: Normal pulses.     Heart sounds: Normal heart sounds. No murmur heard. Pulmonary:     Effort: Pulmonary effort is normal. No respiratory distress.     Breath sounds: Normal breath sounds.  Abdominal:     General: There is no distension.     Palpations: Abdomen is soft.     Tenderness: There is no abdominal tenderness.  Musculoskeletal:     Cervical back: Normal range of motion and neck supple.     Right lower leg: No edema.     Left lower leg: No edema.  Lymphadenopathy:     Cervical: No cervical adenopathy.  Skin:    General: Skin is warm and dry.  Neurological:     General: No focal deficit present.     Mental Status: She is alert and oriented to person, place, and time.  Psychiatric:        Mood and Affect: Mood normal.        Behavior: Behavior normal.           Assessment & Plan:

## 2024-04-22 NOTE — Patient Instructions (Signed)
 Follow up in 3-4 weeks to recheck blood pressure DECREASE Amlodipine  to 5mg  daily ADD the hydrochlorothiazide once daily in the morning Continue to drink plenty of water  and limit your salt Call with any questions or concerns Hang in there!!!

## 2024-04-23 ENCOUNTER — Encounter: Admitting: Family Medicine

## 2024-04-23 ENCOUNTER — Other Ambulatory Visit (HOSPITAL_COMMUNITY): Payer: Self-pay

## 2024-04-24 ENCOUNTER — Other Ambulatory Visit: Payer: Self-pay

## 2024-04-30 ENCOUNTER — Other Ambulatory Visit (HOSPITAL_COMMUNITY): Payer: Self-pay

## 2024-04-30 ENCOUNTER — Other Ambulatory Visit: Payer: Self-pay

## 2024-05-01 ENCOUNTER — Other Ambulatory Visit: Payer: Self-pay

## 2024-05-01 ENCOUNTER — Telehealth: Payer: Self-pay

## 2024-05-01 ENCOUNTER — Other Ambulatory Visit (HOSPITAL_COMMUNITY): Payer: Self-pay

## 2024-05-01 NOTE — Telephone Encounter (Signed)
*  Asthma/Allergy   Pharmacy Patient Advocate Encounter   Received notification from CoverMyMeds that prior authorization for Azelastine -Fluticasone  137-50MCG/ACT suspension  is required/requested.   Insurance verification completed.   The patient is insured through [employer] .   Per test claim: PA required; PA submitted to above mentioned insurance via CoverMyMeds Key/confirmation #/EOC [id number] Status is pending

## 2024-05-01 NOTE — Telephone Encounter (Signed)
 Approved today by [employer] The request has been approved. The authorization is effective from 05/01/2024 to 04/30/2025, as long as the member is enrolled in their current health plan. A written notification letter will follow with additional details. Effective Date: 05/01/2024 Authorization Expiration Date: 04/30/2025

## 2024-05-15 ENCOUNTER — Other Ambulatory Visit (HOSPITAL_COMMUNITY): Payer: Self-pay

## 2024-05-15 ENCOUNTER — Ambulatory Visit (INDEPENDENT_AMBULATORY_CARE_PROVIDER_SITE_OTHER): Admitting: Family Medicine

## 2024-05-15 ENCOUNTER — Ambulatory Visit: Payer: Self-pay | Admitting: Family Medicine

## 2024-05-15 ENCOUNTER — Encounter: Payer: Self-pay | Admitting: Family Medicine

## 2024-05-15 VITALS — BP 126/72 | HR 75 | Temp 97.9°F | Resp 13 | Ht 60.5 in | Wt 128.6 lb

## 2024-05-15 DIAGNOSIS — Z0000 Encounter for general adult medical examination without abnormal findings: Secondary | ICD-10-CM

## 2024-05-15 DIAGNOSIS — I10 Essential (primary) hypertension: Secondary | ICD-10-CM

## 2024-05-15 LAB — BASIC METABOLIC PANEL WITH GFR
BUN: 12 mg/dL (ref 6–23)
CO2: 29 meq/L (ref 19–32)
Calcium: 9.5 mg/dL (ref 8.4–10.5)
Chloride: 101 meq/L (ref 96–112)
Creatinine, Ser: 0.69 mg/dL (ref 0.40–1.20)
GFR: 107.21 mL/min (ref >60.00)
Glucose, Bld: 88 mg/dL (ref 70–99)
Potassium: 4.1 meq/L (ref 3.5–5.1)
Sodium: 137 meq/L (ref 135–145)

## 2024-05-15 MED ORDER — METOPROLOL SUCCINATE ER 25 MG PO TB24
12.5000 mg | ORAL_TABLET | Freq: Every day | ORAL | 3 refills | Status: DC
  Filled 2024-05-15: qty 45, 90d supply, fill #0
  Filled 2024-07-02 – 2024-09-17 (×4): qty 45, 90d supply, fill #1

## 2024-05-15 NOTE — Patient Instructions (Addendum)
 Follow up as scheduled We'll notify you of your lab results and make any changes if needed ADD the Metoprolol on work days.  Start w/ 1/2 tab and increase if needed Keep up the good work- you look great!!! Call with any questions or concerns Hang in there!!

## 2024-05-15 NOTE — Assessment & Plan Note (Signed)
 Ongoing issue.  BP looks great this morning but pt states BP is always good in the morning.  At this point, BP is only elevated on work days.  Will keep Amlodipine  at 5mg  daily as she had side effects at 7.5mg .  Will add Metoprolol XL on work days to help keep BP in check and avoid dropping her low when she is not working.  Pt expressed understanding and is in agreement w/ plan.

## 2024-05-15 NOTE — Progress Notes (Signed)
   Subjective:    Patient ID: Sarah [name], female    DOB: 04/06/1981, 43 y.o.   MRN: [id number]  HPI HTN- at last visit we decreased Amlodipine  to 5mg  daily and added hydrochlorothiazide  12.5mg  daily.  BP today is excellent at 126/72.  Pt reports that BP is always good in the AM but increases as day goes on.  At this point, only increasing at work.  Running 130s/90s in the afternoon.  Felt that she had flushing and some swelling on Amlodipine  7.5mg .  Denies palpitations.     Review of Systems For ROS see HPI     Objective:   Physical Exam Vitals reviewed.  Constitutional:      General: She is not in acute distress.    Appearance: Normal appearance. She is not ill-appearing.  HENT:     Head: Normocephalic and atraumatic.   Eyes:     Extraocular Movements: Extraocular movements intact.     Conjunctiva/sclera: Conjunctivae normal.    Cardiovascular:     Rate and Rhythm: Normal rate.  Pulmonary:     Effort: Pulmonary effort is normal. No respiratory distress.   Skin:    General: Skin is warm and dry.   Neurological:     General: No focal deficit present.     Mental Status: She is alert and oriented to person, place, and time.   Psychiatric:        Mood and Affect: Mood normal.        Behavior: Behavior normal.        Thought Content: Thought content normal.           Assessment & Plan:

## 2024-05-22 ENCOUNTER — Ambulatory Visit (INDEPENDENT_AMBULATORY_CARE_PROVIDER_SITE_OTHER): Admitting: Family Medicine

## 2024-05-22 ENCOUNTER — Encounter: Payer: Self-pay | Admitting: Family Medicine

## 2024-05-22 ENCOUNTER — Other Ambulatory Visit (HOSPITAL_COMMUNITY): Payer: Self-pay

## 2024-05-22 VITALS — BP 124/68 | HR 65 | Temp 98.1°F | Ht 60.5 in | Wt 128.5 lb

## 2024-05-22 DIAGNOSIS — E559 Vitamin D deficiency, unspecified: Secondary | ICD-10-CM

## 2024-05-22 DIAGNOSIS — Z0000 Encounter for general adult medical examination without abnormal findings: Secondary | ICD-10-CM | POA: Diagnosis not present

## 2024-05-22 DIAGNOSIS — I10 Essential (primary) hypertension: Secondary | ICD-10-CM | POA: Diagnosis not present

## 2024-05-22 MED ORDER — TRIAMCINOLONE ACETONIDE 0.1 % EX OINT
1.0000 | TOPICAL_OINTMENT | Freq: Two times a day (BID) | CUTANEOUS | 1 refills | Status: DC
  Filled 2024-05-22: qty 80, 30d supply, fill #0
  Filled 2024-07-08: qty 90, 45d supply, fill #1

## 2024-05-22 MED ORDER — AMLODIPINE BESYLATE 5 MG PO TABS: 5.0000 mg | ORAL_TABLET | Freq: Every day | ORAL | Status: DC

## 2024-05-22 NOTE — Progress Notes (Signed)
   Subjective:    Patient ID: Sarah [name], female    DOB: 1981-02-04, 43 y.o.   MRN: [id number]  HPI CPE- UTD on pap, mammo, Tdap  Patient Care Team    Relationship Specialty Notifications Start End  Mahlon Comer BRAVO, MD PCP - General Family Medicine  11/26/18      Health Maintenance  Topic Date Due   Pneumococcal Vaccine 9-36 Years old (1 of 2 - PCV) Never done   Hepatitis B Vaccines (1 of 3 - 19+ 3-dose series) Never done   HPV VACCINES (1 - 3-dose SCDM series) Never done   COVID-19 Vaccine (4 - 2024-25 season) 07/22/2023   INFLUENZA VACCINE  06/20/2024   Cervical Cancer Screening (HPV/Pap Cotest)  12/22/2024   DTaP/Tdap/Td (4 - Td or Tdap) 08/11/2027   Hepatitis C Screening  Completed   HIV Screening  Completed   Meningococcal B Vaccine  Aged Out      Review of Systems Patient reports no vision/hearing changes, adenopathy,fever, weight change,  persistant/recurrent hoarseness , swallowing issues, chest pain, palpitations, edema, persistant/recurrent cough, hemoptysis, dyspnea (rest/exertional/paroxysmal nocturnal), gastrointestinal bleeding (melena, rectal bleeding), abdominal pain, significant heartburn, bowel changes, GU symptoms (dysuria, hematuria, incontinence), Gyn symptoms (abnormal  bleeding, pain),  syncope, focal weakness, memory loss, numbness & tingling, skin/hair/nail changes, abnormal bruising or bleeding, anxiety, or depression.     Objective:   Physical Exam General Appearance:    Alert, cooperative, no distress, appears stated age  Head:    Normocephalic, without obvious abnormality, atraumatic  Eyes:    PERRL, conjunctiva/corneas clear, EOM's intact both eyes  Ears:    Normal TM's and external ear canals, both ears  Nose:   Nares normal, septum midline, mucosa normal, no drainage    or sinus tenderness  Throat:   Lips, mucosa, and tongue normal; teeth and gums normal  Neck:   Supple, symmetrical, trachea midline, no adenopathy;    Thyroid : no  enlargement/tenderness/nodules  Back:     Symmetric, no curvature, ROM normal, no CVA tenderness  Lungs:     Clear to auscultation bilaterally, respirations unlabored  Chest Wall:    No tenderness or deformity   Heart:    Regular rate and rhythm, S1 and S2 normal, no murmur, rub   or gallop  Breast Exam:    Deferred to GYN  Abdomen:     Soft, non-tender, bowel sounds active all four quadrants,    no masses, no organomegaly  Genitalia:    Deferred to GYN  Rectal:    Extremities:   Extremities normal, atraumatic, no cyanosis or edema  Pulses:   2+ and symmetric all extremities  Skin:   Skin color, texture, turgor normal, no rashes or lesions  Lymph nodes:   Cervical, supraclavicular, and axillary nodes normal  Neurologic:   CNII-XII intact, normal strength, sensation and reflexes    throughout          Assessment & Plan:

## 2024-05-22 NOTE — Patient Instructions (Addendum)
 Follow up in 6 months to recheck blood pressure Keep up the good work!  You look great!!! On the days you take the Metoprolol , take HALF the Amlodipine  Call with any questions or concerns Stay Safe!  Stay Healthy! Have a great summer!!!

## 2024-05-22 NOTE — Assessment & Plan Note (Signed)
 Pt's PE WNL.  UTD on pap, mammo, Tdap.  Reviewed labs done on Doctor's Day- no need to repeat.  Anticipatory guidance provided.

## 2024-07-03 ENCOUNTER — Other Ambulatory Visit (HOSPITAL_COMMUNITY): Payer: Self-pay

## 2024-07-03 NOTE — Progress Notes (Unsigned)
 [name] [address] [place] [address] Phone: [phone] Subjective:    I'm seeing this patient by the request  of:  Mahlon Comer BRAVO, MD  CC: Neck and back pain follow-up  YEP:Dlagzrupcz  Khira Cudmore is a 43 y.o. female coming in with complaint of back and neck pain. OMT 12/14/2023. Patient states some tightness in her upper back and neck area. She is wondering about HEP.   Medications patient has been prescribed: None  Taking:      Since we have seen patient in April patient did have a nephrectomy.   Reviewed prior external information including notes and imaging from previsou exam, outside providers and external EMR if available.   As well as notes that were available from care everywhere and other healthcare systems.  Past medical history, social, surgical and family history all reviewed in electronic medical record.  No pertanent information unless stated regarding to the chief complaint.   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 POSITIVE muscle aches  Objective  Blood pressure 98/66, pulse 65, height 5' 0.5 (1.537 m), weight 130 lb (59 kg), SpO2 99%.   General: No apparent distress alert and oriented x3 mood and affect normal, dressed appropriately.  HEENT: Pupils equal, extraocular movements intact  Respiratory: Patient's speak in full sentences and does not appear short of breath  Cardiovascular: No lower extremity edema, non tender, no  erythema  Gait relatively normal MSK:  Back does have significant tightness noted in the thoracolumbar juncture as well as in the lumbar paraspinal musculature on the right greater than the left.  Negative straight leg test noted.  Good range of motion of the hips bilaterally.  Patient is slow on moving.  No abdominal pain.  Osteopathic findings  C2 flexed rotated and side bent right C6 flexed rotated and side bent left T3 extended rotated and side bent right inhaled rib T5 extended rotated and side bent right T9 extended rotated and side bent right L1 flexed rotated and side bent right Sacrum right on right       Assessment and Plan:  Low back pain Seems to be more right sided, likely postsurgical, scar tissue formation could be potentially contributing.  Discussed with patient range of motion exercises and work with Event organiser.  Will need to work on core strengthening in the long run but would like to start slower than anticipated.  Increase activity discussed icing regimen.  Follow-up again in 6 to 8 weeks otherwise.    Nonallopathic problems  Decision today to treat with OMT was based on Physical Exam  After verbal consent patient was treated with HVLA, ME, FPR techniques in cervical, rib, thoracic, lumbar, and sacral  areas  Patient tolerated the procedure well with improvement in symptoms  Patient given exercises, stretches and lifestyle modifications  See medications in patient instructions if given  Patient will follow up in 4-8 weeks     The above documentation has been reviewed  and is accurate and complete Deanda Ruddell M Colbie Danner, DO         Note: This dictation was prepared with Dragon dictation along with smaller phrase technology. Any transcriptional errors that result from this process are unintentional.

## 2024-07-08 ENCOUNTER — Other Ambulatory Visit: Payer: Self-pay

## 2024-07-08 ENCOUNTER — Other Ambulatory Visit (HOSPITAL_COMMUNITY): Payer: Self-pay

## 2024-07-09 ENCOUNTER — Encounter: Payer: Self-pay | Admitting: Family Medicine

## 2024-07-09 ENCOUNTER — Other Ambulatory Visit: Payer: Self-pay

## 2024-07-09 ENCOUNTER — Ambulatory Visit (INDEPENDENT_AMBULATORY_CARE_PROVIDER_SITE_OTHER): Admitting: Family Medicine

## 2024-07-09 VITALS — BP 98/66 | HR 65 | Ht 60.5 in | Wt 130.0 lb

## 2024-07-09 DIAGNOSIS — M9902 Segmental and somatic dysfunction of thoracic region: Secondary | ICD-10-CM | POA: Diagnosis not present

## 2024-07-09 DIAGNOSIS — M9901 Segmental and somatic dysfunction of cervical region: Secondary | ICD-10-CM

## 2024-07-09 DIAGNOSIS — G8929 Other chronic pain: Secondary | ICD-10-CM | POA: Diagnosis not present

## 2024-07-09 DIAGNOSIS — M9904 Segmental and somatic dysfunction of sacral region: Secondary | ICD-10-CM

## 2024-07-09 DIAGNOSIS — M9903 Segmental and somatic dysfunction of lumbar region: Secondary | ICD-10-CM | POA: Diagnosis not present

## 2024-07-09 DIAGNOSIS — M9908 Segmental and somatic dysfunction of rib cage: Secondary | ICD-10-CM | POA: Diagnosis not present

## 2024-07-09 DIAGNOSIS — M545 Low back pain, unspecified: Secondary | ICD-10-CM | POA: Insufficient documentation

## 2024-07-09 NOTE — Patient Instructions (Addendum)
 Good to see you  Please work on the home exercises the athletic trainer went over with you:  View at [email or web address] code [email or web address]  See me again in  5-7 weeks

## 2024-07-09 NOTE — Assessment & Plan Note (Signed)
 Seems to be more right sided, likely postsurgical, scar tissue formation could be potentially contributing.  Discussed with patient range of motion exercises and work with Event organiser.  Will need to work on core strengthening in the long run but would like to start slower than anticipated.  Increase activity discussed icing regimen.  Follow-up again in 6 to 8 weeks otherwise.

## 2024-07-15 ENCOUNTER — Other Ambulatory Visit: Payer: Self-pay | Admitting: Urology

## 2024-07-15 DIAGNOSIS — C641 Malignant neoplasm of right kidney, except renal pelvis: Secondary | ICD-10-CM

## 2024-07-17 DIAGNOSIS — J301 Allergic rhinitis due to pollen: Secondary | ICD-10-CM | POA: Diagnosis not present

## 2024-07-17 DIAGNOSIS — J3081 Allergic rhinitis due to animal (cat) (dog) hair and dander: Secondary | ICD-10-CM | POA: Diagnosis not present

## 2024-07-17 NOTE — Progress Notes (Signed)
 VIAL MADE 07-17-24

## 2024-08-08 ENCOUNTER — Other Ambulatory Visit

## 2024-08-29 ENCOUNTER — Other Ambulatory Visit: Payer: Self-pay | Admitting: Urology

## 2024-08-29 ENCOUNTER — Ambulatory Visit
Admission: RE | Admit: 2024-08-29 | Discharge: 2024-08-29 | Disposition: A | Discharge status: Home / Self Care | Source: Ambulatory Visit | Attending: Urology | Admitting: Urology

## 2024-08-29 DIAGNOSIS — C641 Malignant neoplasm of right kidney, except renal pelvis: Secondary | ICD-10-CM

## 2024-08-29 DIAGNOSIS — C649 Malignant neoplasm of unspecified kidney, except renal pelvis: Secondary | ICD-10-CM

## 2024-08-29 MED ORDER — GADOPICLENOL 0.5 MMOL/ML IV SOLN
6.0000 mL | Freq: Once | INTRAVENOUS | Status: AC | PRN
  Administered 2024-08-29: 6 mL via INTRAVENOUS

## 2024-09-11 ENCOUNTER — Other Ambulatory Visit (HOSPITAL_BASED_OUTPATIENT_CLINIC_OR_DEPARTMENT_OTHER): Payer: Self-pay

## 2024-09-12 DIAGNOSIS — H5213 Myopia, bilateral: Secondary | ICD-10-CM | POA: Diagnosis not present

## 2024-09-16 NOTE — Progress Notes (Unsigned)
 [name] [address] [place] [address] Phone: [phone] Subjective:   Sarah [name], am serving as a scribe for Dr. Arthea Claudene.  I'm seeing this patient by the request  of:  Mahlon Comer BRAVO, MD  CC: neck and back pain   YEP:Dlagzrupcz  [name] is a 43 y.o. female coming in with complaint of back and neck pain. OMT 07/09/2024. 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  No sign of any metastatic disease.   Reviewed prior external information including notes and imaging from previsou exam, outside providers and external EMR if available.   As well as notes that were available from care everywhere and other healthcare systems.  Past medical history, social, surgical and family history all reviewed in electronic medical record.  No pertanent information unless stated regarding to the chief complaint.   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 POSITIVE muscle aches  Objective  Blood pressure 92/66, pulse 66, height 5' (1.524 m), weight 130 lb (59 kg), SpO2 100%.   General: No apparent distress alert and oriented x3 mood and affect normal, dressed appropriately.  HEENT: Pupils equal, extraocular movements intact  Respiratory: Patient's speak in full sentences and does not  appear short of breath  Cardiovascular: No lower extremity edema, non tender, no erythema  Gait relatively normal MSK:  Back does have some loss of lordosis noted.  He does have significant tightness previous individual for FABER test.  Some deconditioning to the  Osteopathic findings  C2 flexed rotated and side bent right C6 flexed rotated and side bent left T3 extended rotated and side bent right inhaled rib L2 flexed rotated and side bent right  L3 flexed rotated and side bent left Sacrum right on right       Assessment and Plan:  Low back pain Overall doing very well after her partial nephrectomy.  Patient recommended home exercises otherwise, continue to work on core strengthening.  Increase activity slowly.  Discussed icing regimen.  Follow-up again in 6 to 8 weeks otherwise.    Nonallopathic problems  Decision today to treat with OMT was based on Physical Exam  After verbal consent patient was treated with HVLA, ME, FPR techniques in cervical, rib, thoracic, lumbar, and sacral  areas  Patient tolerated the procedure well with improvement in symptoms  Patient given exercises, stretches and lifestyle modifications  See medications in patient instructions if given  Patient will follow up in 4-8 weeks     The above documentation has been reviewed and is accurate and complete [name] M Niclas Markell, DO         Note: This dictation was prepared with Dragon dictation along with smaller phrase technology. Any transcriptional errors that result from this process are unintentional.

## 2024-09-17 ENCOUNTER — Other Ambulatory Visit (HOSPITAL_COMMUNITY): Payer: Self-pay

## 2024-09-17 ENCOUNTER — Ambulatory Visit (INDEPENDENT_AMBULATORY_CARE_PROVIDER_SITE_OTHER): Admitting: Family Medicine

## 2024-09-17 ENCOUNTER — Other Ambulatory Visit: Payer: Self-pay

## 2024-09-17 ENCOUNTER — Encounter: Payer: Self-pay | Admitting: Family Medicine

## 2024-09-17 ENCOUNTER — Other Ambulatory Visit: Payer: Self-pay | Admitting: Allergy

## 2024-09-17 VITALS — BP 92/66 | HR 66 | Ht 60.0 in | Wt 130.0 lb

## 2024-09-17 DIAGNOSIS — M9908 Segmental and somatic dysfunction of rib cage: Secondary | ICD-10-CM | POA: Diagnosis not present

## 2024-09-17 DIAGNOSIS — M9901 Segmental and somatic dysfunction of cervical region: Secondary | ICD-10-CM

## 2024-09-17 DIAGNOSIS — M9903 Segmental and somatic dysfunction of lumbar region: Secondary | ICD-10-CM

## 2024-09-17 DIAGNOSIS — M545 Low back pain, unspecified: Secondary | ICD-10-CM

## 2024-09-17 DIAGNOSIS — G8929 Other chronic pain: Secondary | ICD-10-CM | POA: Diagnosis not present

## 2024-09-17 DIAGNOSIS — M9904 Segmental and somatic dysfunction of sacral region: Secondary | ICD-10-CM

## 2024-09-17 DIAGNOSIS — M9902 Segmental and somatic dysfunction of thoracic region: Secondary | ICD-10-CM | POA: Diagnosis not present

## 2024-09-17 MED ORDER — AZELASTINE-FLUTICASONE 137-50 MCG/ACT NA SUSP
1.0000 | Freq: Two times a day (BID) | NASAL | 11 refills | Status: AC
  Filled 2024-09-17: qty 23, 30d supply, fill #0
  Filled 2024-11-08: qty 23, 30d supply, fill #1

## 2024-09-17 NOTE — Assessment & Plan Note (Signed)
 Overall doing very well after her partial nephrectomy.  Patient recommended home exercises otherwise, continue to work on core strengthening.  Increase activity slowly.  Discussed icing regimen.  Follow-up again in 6 to 8 weeks otherwise.

## 2024-09-18 ENCOUNTER — Other Ambulatory Visit: Payer: Self-pay

## 2024-09-24 DIAGNOSIS — C641 Malignant neoplasm of right kidney, except renal pelvis: Secondary | ICD-10-CM | POA: Diagnosis not present

## 2024-09-29 DIAGNOSIS — R399 Unspecified symptoms and signs involving the genitourinary system: Secondary | ICD-10-CM | POA: Diagnosis not present

## 2024-09-29 DIAGNOSIS — C641 Malignant neoplasm of right kidney, except renal pelvis: Secondary | ICD-10-CM | POA: Diagnosis not present

## 2024-10-09 ENCOUNTER — Other Ambulatory Visit: Payer: Self-pay

## 2024-10-09 ENCOUNTER — Ambulatory Visit (INDEPENDENT_AMBULATORY_CARE_PROVIDER_SITE_OTHER): Admitting: Family Medicine

## 2024-10-09 ENCOUNTER — Other Ambulatory Visit (HOSPITAL_COMMUNITY): Payer: Self-pay

## 2024-10-09 ENCOUNTER — Encounter: Payer: Self-pay | Admitting: Family Medicine

## 2024-10-09 VITALS — BP 104/78 | HR 78 | Temp 98.2°F | Ht 60.0 in | Wt 128.5 lb

## 2024-10-09 DIAGNOSIS — I10 Essential (primary) hypertension: Secondary | ICD-10-CM

## 2024-10-09 MED ORDER — AMLODIPINE BESYLATE 5 MG PO TABS
5.0000 mg | ORAL_TABLET | Freq: Every day | ORAL | 1 refills | Status: AC
Start: 2024-10-09 — End: ?
  Filled 2024-10-09 – 2024-10-11 (×3): qty 90, 90d supply, fill #0

## 2024-10-09 MED ORDER — METOPROLOL SUCCINATE ER 25 MG PO TB24
12.5000 mg | ORAL_TABLET | Freq: Every day | ORAL | 3 refills | Status: AC
  Filled 2024-10-09: qty 30, 60d supply, fill #0

## 2024-10-09 NOTE — Patient Instructions (Addendum)
 Follow up as needed or as scheduled No need for labs at this time- you look great!! Keep up the good work! Call with any questions or concerns Stay safe! Stay healthy! Happy Holidays!!

## 2024-10-09 NOTE — Progress Notes (Signed)
   Subjective:    Patient ID: Sarah [name], female    DOB: September 09, 1981, 43 y.o.   MRN: [id number]  HPI HTN- chronic problem, on Amlodipine  2.5mg  daily, HCTZ 12.5mg  daily, and Metoprolol  6.25mg  daily.  No CP, SOB, HA's, visual changes, edema.   Review of Systems For ROS see HPI     Objective:   Physical Exam Vitals reviewed.  Constitutional:      General: She is not in acute distress.    Appearance: Normal appearance. She is well-developed. She is not ill-appearing.  HENT:     Head: Normocephalic and atraumatic.  Eyes:     Conjunctiva/sclera: Conjunctivae normal.     Pupils: Pupils are equal, round, and reactive to light.  Neck:     Thyroid : No thyromegaly.  Cardiovascular:     Rate and Rhythm: Normal rate and regular rhythm.     Pulses: Normal pulses.     Heart sounds: Normal heart sounds. No murmur heard. Pulmonary:     Effort: Pulmonary effort is normal. No respiratory distress.     Breath sounds: Normal breath sounds.  Abdominal:     General: There is no distension.     Palpations: Abdomen is soft.     Tenderness: There is no abdominal tenderness.  Musculoskeletal:     Cervical back: Normal range of motion and neck supple.     Right lower leg: No edema.     Left lower leg: No edema.  Lymphadenopathy:     Cervical: No cervical adenopathy.  Skin:    General: Skin is warm and dry.  Neurological:     General: No focal deficit present.     Mental Status: She is alert and oriented to person, place, and time.  Psychiatric:        Mood and Affect: Mood normal.        Behavior: Behavior normal.        Thought Content: Thought content normal.           Assessment & Plan:

## 2024-10-09 NOTE — Assessment & Plan Note (Signed)
 Excellent control today.  Pt is adjusting medication- taking 1/2 tab, 1/4 tab- as needed based on low BP or fatigue.  No need for labs today- had at Urology.  No med changes at this time.

## 2024-10-11 ENCOUNTER — Other Ambulatory Visit (HOSPITAL_COMMUNITY): Payer: Self-pay

## 2024-10-20 ENCOUNTER — Other Ambulatory Visit (HOSPITAL_COMMUNITY): Payer: Self-pay

## 2024-11-17 NOTE — Progress Notes (Unsigned)
 " [facility] [address] [place] [address] Phone: [phone] Subjective:   [name], am serving as a scribe for Dr. Arthea Claudene.  I'm seeing this patient by the request  of:  Mahlon Comer BRAVO, MD  CC: Back and neck pain follow-up  YEP:Dlagzrupcz  Malini Flemings is a 43 y.o. female coming in with complaint of back and neck pain. OMT 09/17/2024. Patient states continues to have some discomfort and pain but nothing since here.  Patient feels like she just needs a potential adjustment.  Has been able to be at it, recently did get a new treadmill.  Medications patient has been prescribed: None  Taking:         Reviewed prior external information including notes and imaging from previsou exam, outside providers and external EMR if available.   As well as notes that were available from care everywhere and other healthcare systems.  Past medical history, social, surgical and family history all reviewed in electronic medical record.  No pertanent information unless stated regarding to the chief complaint.   Past Medical History:  Diagnosis Date   Allergy     GERD (gastroesophageal reflux disease)    Headache    History of urinary incontinence    just after birth of children   Hypertension    Positive TB test    quantiferon was negative    Allergies[1]   Review of Systems:  No headache, visual changes, nausea, vomiting, diarrhea, constipation, dizziness, abdominal pain, skin rash, fevers, chills, night sweats, weight loss, swollen lymph nodes, body aches, joint swelling, chest pain, shortness of breath, mood changes. POSITIVE muscle aches  Objective  Blood pressure 108/82, pulse 70, height 5' (1.524 m), weight 128 lb (58.1 kg), SpO2 98%.   General: No apparent distress alert and oriented x3 mood and affect normal, dressed appropriately.  HEENT: Pupils equal, extraocular movements intact  Respiratory: Patient's speak in full  sentences and does not appear short of breath  Cardiovascular: No lower extremity edema, non tender, no erythema  Gait relatively normal MSK:  Back does have some loss of lordosis noted.  Some tenderness to palpation in the paraspinal musculature.  Osteopathic findings  C3 flexed rotated and side bent right C5 flexed rotated and side bent left T3 extended rotated and side bent right inhaled rib T9 extended rotated and side bent left L2 flexed rotated and side bent right L3 flexed rotated and side bent left Sacrum right on right     Assessment and Plan:  Low back pain Mild exacerbation noted.  Discussed icing regimen and home exercises, increase activity slowly.  Discussed hip abductor strengthening exercises.  Follow-up with me again as needed    Nonallopathic problems  Decision today to treat with OMT was based on Physical Exam  After verbal consent patient was treated with HVLA, ME, FPR techniques in cervical, rib, thoracic, lumbar, and sacral  areas  Patient tolerated the procedure well with improvement in symptoms  Patient given exercises, stretches and lifestyle modifications  See medications in patient instructions if given  Patient will follow up in 4-8 weeks    The above documentation has been reviewed and is accurate and complete Wells Mabe M Reia Viernes, DO          Note: This dictation was prepared with Dragon dictation along with smaller phrase technology. Any transcriptional errors that result from this process are unintentional.            [1] No  Known Allergies  "

## 2024-11-19 ENCOUNTER — Encounter: Payer: Self-pay | Admitting: Family Medicine

## 2024-11-19 ENCOUNTER — Ambulatory Visit (INDEPENDENT_AMBULATORY_CARE_PROVIDER_SITE_OTHER): Admitting: Family Medicine

## 2024-11-19 VITALS — BP 108/82 | HR 70 | Ht 60.0 in | Wt 128.0 lb

## 2024-11-19 DIAGNOSIS — M9903 Segmental and somatic dysfunction of lumbar region: Secondary | ICD-10-CM

## 2024-11-19 DIAGNOSIS — M9901 Segmental and somatic dysfunction of cervical region: Secondary | ICD-10-CM | POA: Diagnosis not present

## 2024-11-19 DIAGNOSIS — M9902 Segmental and somatic dysfunction of thoracic region: Secondary | ICD-10-CM | POA: Diagnosis not present

## 2024-11-19 DIAGNOSIS — M9908 Segmental and somatic dysfunction of rib cage: Secondary | ICD-10-CM | POA: Diagnosis not present

## 2024-11-19 DIAGNOSIS — M9904 Segmental and somatic dysfunction of sacral region: Secondary | ICD-10-CM | POA: Diagnosis not present

## 2024-11-19 DIAGNOSIS — G8929 Other chronic pain: Secondary | ICD-10-CM | POA: Diagnosis not present

## 2024-11-19 DIAGNOSIS — M545 Low back pain, unspecified: Secondary | ICD-10-CM | POA: Diagnosis not present

## 2024-11-19 NOTE — Assessment & Plan Note (Signed)
 Mild exacerbation noted.  Discussed icing regimen and home exercises, increase activity slowly.  Discussed hip abductor strengthening exercises.  Follow-up with me again as needed

## 2024-12-25 ENCOUNTER — Other Ambulatory Visit (HOSPITAL_COMMUNITY): Payer: Self-pay

## 2025-05-28 ENCOUNTER — Encounter: Admitting: Family Medicine
